# Patient Record
Sex: Male | Born: 1970 | Race: White | Hispanic: No | Marital: Single | Smoking: Former smoker
Health system: Southern US, Community
[De-identification: ages and names within clinical notes are randomized; demographics above are authoritative.]

## PROBLEM LIST (undated history)

## (undated) DIAGNOSIS — E669 Obesity, unspecified: Secondary | ICD-10-CM

## (undated) DIAGNOSIS — M1280 Other specific arthropathies, not elsewhere classified, unspecified site: Secondary | ICD-10-CM

## (undated) DIAGNOSIS — K760 Fatty (change of) liver, not elsewhere classified: Secondary | ICD-10-CM

## (undated) DIAGNOSIS — E119 Type 2 diabetes mellitus without complications: Secondary | ICD-10-CM

## (undated) HISTORY — DX: Other specific arthropathies, not elsewhere classified, unspecified site: M12.80

## (undated) HISTORY — DX: Fatty (change of) liver, not elsewhere classified: K76.0

## (undated) HISTORY — DX: Obesity, unspecified: E66.9

## (undated) HISTORY — DX: Type 2 diabetes mellitus without complications: E11.9

---

## 2005-10-29 ENCOUNTER — Ambulatory Visit: Payer: Self-pay | Admitting: Family Medicine

## 2007-12-20 ENCOUNTER — Ambulatory Visit: Payer: Self-pay | Admitting: Family Medicine

## 2009-04-19 ENCOUNTER — Ambulatory Visit: Payer: Self-pay | Admitting: Family Medicine

## 2009-04-19 DIAGNOSIS — J069 Acute upper respiratory infection, unspecified: Secondary | ICD-10-CM | POA: Insufficient documentation

## 2010-01-29 ENCOUNTER — Encounter (INDEPENDENT_AMBULATORY_CARE_PROVIDER_SITE_OTHER): Payer: Self-pay | Admitting: *Deleted

## 2010-03-11 ENCOUNTER — Ambulatory Visit: Payer: Self-pay | Admitting: Family Medicine

## 2010-03-11 DIAGNOSIS — H60339 Swimmer's ear, unspecified ear: Secondary | ICD-10-CM | POA: Insufficient documentation

## 2010-03-12 ENCOUNTER — Telehealth: Payer: Self-pay | Admitting: Family Medicine

## 2010-04-02 ENCOUNTER — Telehealth (INDEPENDENT_AMBULATORY_CARE_PROVIDER_SITE_OTHER): Payer: Self-pay | Admitting: *Deleted

## 2010-04-07 ENCOUNTER — Ambulatory Visit: Payer: Self-pay | Admitting: Family Medicine

## 2010-04-07 DIAGNOSIS — M79609 Pain in unspecified limb: Secondary | ICD-10-CM | POA: Insufficient documentation

## 2010-07-29 NOTE — Letter (Signed)
Summary: Nadara Eaton letter  [place] at [facility]  [address] [place], [place] [address]   Phone: [phone]  Fax: [phone]       01/29/2010 MRN: [id number]  [facility] [address] [place], [place]  [address]  Dear Mr. [facility] [place], and [facility] announce the retirement of Arta Silence, M.D., from full-time practice at the [facility] office effective December 26, 2009 and his plans of returning part-time.  It is important to Dr. Hetty Ely and to our practice that you understand that [facility] has seven physicians in our office for your health care needs.  We will continue to offer the same exceptional care that you have today.    Dr. Hetty Ely has spoken to many of you about his plans for retirement and returning part-time in the fall.   We will continue to work with you through the transition to schedule appointments for you in the office and meet the high standards that [facility] is committed to.   Again, it is with great pleasure that we share the news that Dr. Hetty Ely will return to [facility] at [facility] in October of 2011 with a reduced schedule.    If you have any questions, or would like to request an appointment with one of our physicians, please call us at [phone] and press the option for Scheduling an appointment.  We take pleasure in providing you with excellent patient care and look forward to seeing you at your next office visit.  Our [facility] Physicians are:  Tillman Abide, M.D. Laurita Quint, M.D. Roxy Manns, M.D. Kerby Nora, M.D. Hannah Beat, M.D. Ruthe Mannan, M.D. We proudly welcomed Raechel Ache, M.D. and Eustaquio Boyden, M.D. to the practice in July/August 2011.  Sincerely,  [facility]

## 2010-07-29 NOTE — Assessment & Plan Note (Signed)
Summary: 15 MIN INNER EAR/NEEDS TO ESTABH W/ DOC LATER/DLO   Vital Signs:  Patient profile:   40 year old male Height:      69 inches Weight:      225.75 pounds BMI:     33.46 Temp:     98.6 degrees F oral Pulse rate:   84 / minute Pulse rhythm:   regular BP sitting:   130 / 76  (left arm) Cuff size:   large  Vitals Entered By: Lewanda Rife LPN (March 11, 2010 11:20 AM) CC: ?inner ear, Rt ear hurts.   History of Present Illness: thinks he has an ear infection  started sunday and then worse monday am  swelling of outer ear and pain cannot hear out of the ear  no swimming-- ? 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 Eyes:  Denies blurring, discharge, and eye irritation. ENT:  Complains of ear discharge and earache; denies nasal congestion, postnasal drainage, sinus pressure, and sore throat. CV:  Complains of lightheadness; denies chest pain or discomfort and palpitations. Resp:  Denies cough and shortness of breath. GI:  Denies nausea and vomiting. Derm:  Denies itching and rash. Neuro:  Complains of sensation of room spinning; mild dizziness. Allergy:  Denies seasonal allergies.  Physical Exam  General:  overweight but generally well appearing  Head:  normocephalic, atraumatic, and no abnormalities observed.   Eyes:  vision grossly intact, pupils equal, pupils round, pupils reactive to light, and no injection.   Ears:  R ear-- some mild swelling of pinna with warmth but no tenderness  canal is swollen - some cloudy discharge / is hyperemic  L ear - nl with clear canal and TM Nose:  no nasal discharge.   Mouth:  pharynx pink and moist.   Neck:  No deformities,  masses, or tenderness noted. Lungs:  CTA Heart:  RRR Skin:  no rash Cervical Nodes:  No lymphadenopathy noted Psych:  normal affect, talkative and pleasant    Impression & Recommendations:  Problem # 1:  OTITIS EXTERNA, ACUTE (ICD-380.12) Assessment New  R acute otitis externa with some canal swelling and also scant cloudy drainage  will tx with cortisporin- this has worked well for him in the past  warm compress/ analgesic as needed pt advised to update me if symptoms worsen or do not improve - esp if worse ear pain or fever warned about avoiding water in ear for future off work several days  His updated medication list for this problem includes:    Cortisporin 3.5-10000-1 Soln (Neomycin-polymyxin-hc) .Marland Kitchen... 3-4 drops in affected ear 3-4 times per day (while awake)  Orders: Prescription Created Electronically [phone])  Complete Medication List: 1)  Cortisporin 3.5-10000-1 Soln (Neomycin-polymyxin-hc) .... 3-4 drops in affected ear 3-4 times per day (while awake)  Patient Instructions: 1)  use the cortisporin ear solution as directed  2)  update me if worse pain / swelling or any fever  3)  warm compress is ok over ear as needed  4)  tylenol for pain or fever if needed  5)  update me if not improving by the end of the week  Prescriptions: CORTISPORIN 3.5-10000-1 SOLN (NEOMYCIN-POLYMYXIN-HC) 3-4 drops in affected ear 3-4 times per day (while awake)  #1 bottle x 0   Entered and Authorized by:   Judith Part MD   Signed by:   Judith Part MD on 03/11/2010   Method used:   Electronically to        [id number] (retail)       [address]       [place], [place]  [address]       Ph: [phone]       Fax: [phone]   RxID:   [id number]   Prior Medications (reviewed today): None Current Allergies (reviewed today): No known allergies

## 2010-07-29 NOTE — Letter (Signed)
Summary: Out of Work  [employer] at [facility]  [address] [place], [place] [address]   Phone: [phone]  Fax: [phone]    March 11, 2010   Employee:  Tim Hawkins [employer]    To Whom It May Concern:   For Medical reasons, please excuse the above named employee from work for the following dates:  Start:   03/11/2010  End:   can return 09/15/ 2011 if he is feeling better  If you need additional information, please feel free to contact our office.         Sincerely,    Judith Part MD

## 2010-07-29 NOTE — Assessment & Plan Note (Signed)
Summary: CHECK FINGERNAIL/CLE   Vital Signs:  Patient profile:   40 year old male Height:      69 inches Weight:      225.0 pounds BMI:     33.35 Temp:     98.6 degrees F oral Pulse rate:   84 / minute Pulse rhythm:   regular BP sitting:   130 / 72  (left arm) Cuff size:   large  Vitals Entered By: Benny Lennert CMA Duncan Dull) (April 07, 2010 11:03 AM)  History of Present Illness: Chief complaint check finger nail  L middle finger not sure - does not think trauma chews nails a lot ? fungus  .REVIEW OF SYSTEMS GEN: No acute illnesses, no fever, chills, sweats. CV: No chest pain or SOB GI: No noted N or V Otherwise, pertinent positives and negatives are noted in the HPI.   GEN: Well-developed,well-nourished,in no acute distress; alert,appropriate and cooperative throughout examination HEENT: Normocephalic and atraumatic without obvious abnormalities. No apparent alopecia or balding. Ears, externally no deformities PULM: Breathing comfortably in no respiratory distress EXT: No clubbing, cyanosis, or edema, chewed fingernails, L middle finger, whitish at base PSYCH: Normally interactive. Cooperative during the interview. Pleasant. Friendly and conversant. Not anxious or depressed appearing. Normal, full affect.    Allergies (verified): No Known Drug Allergies   Impression & Recommendations:  Problem # 1:  FINGER PAIN (ICD-729.5) reassured. looks mostly like result of trauma. i would not do anything ? small degree of fungus, multiple, but will not get better with his degree of chewing  Prior Medications (reviewed today): None Current Allergies (reviewed today): No known allergies

## 2010-07-29 NOTE — Progress Notes (Signed)
Summary: Information from [employer]....  Phone Note Other Incoming   Summary of Call: [employer] called- Gave claim # [id number]- call back # [phone]. [employer] needs addtional information on a claim. Told [employer] we did not have an authorizations to release information, says she will be in contact w/ the pts to bring signed released form to the office.Daine Gip  April 02, 2010 2:24 PM  Initial call taken by: Daine Gip,  April 02, 2010 2:24 PM     Appended Document: Information from [facility].... Recd paperwork from [facility], sent to Dr. Milinda Antis...cdavis 04-08-2010  Appended Document: Information from [employer].... Dr Milinda Antis asked me to contact pt to see if he was out of work more the 03/11/10 thru 03/13/10. Pt said he went to specialist Dr Willeen Cass at [facility]. Pt was taken out of work until 03/17/10. Pt said his ear was doing fine - no problems. the form is on your shelf in the in box.  Appended Document: Information from [employer].... thanks for the update - I completed the form

## 2010-07-29 NOTE — Progress Notes (Signed)
Summary: ear is not any better  Phone Note Call from Patient Call back at Home Phone [phone]   Caller: Patient Summary of Call: Pt was seen yesterday for ear infection.  He says this is no better, still swollen and some pain. No fever.  I advised him that since he just started the antibiotic yesterday he needs to give that more time to work.  Pt agreed. Initial call taken by: Lowella Petties CMA,  March 12, 2010 2:46 PM  Follow-up for Phone Call        it is a bit early to tell  please update me tomorrow (of course earlier if fever or more pain or swelling) Follow-up by: Judith Part MD,  March 12, 2010 4:21 PM  Additional Follow-up for Phone Call Additional follow up Details #1::        Left message for patient to call back. Lewanda Rife LPN  March 12, 2010 4:40 PM   Patient notified as instructed by telephone. Lewanda Rife LPN  March 12, 2010 5:15 PM

## 2012-08-27 ENCOUNTER — Emergency Department: Payer: Self-pay | Admitting: Emergency Medicine

## 2012-08-27 DIAGNOSIS — K760 Fatty (change of) liver, not elsewhere classified: Secondary | ICD-10-CM

## 2012-08-27 HISTORY — DX: Fatty (change of) liver, not elsewhere classified: K76.0

## 2012-08-27 LAB — COMPREHENSIVE METABOLIC PANEL
Alkaline Phosphatase: 124 U/L (ref 50–136)
Anion Gap: 2 — ABNORMAL LOW (ref 7–16)
BUN: 11 mg/dL (ref 7–18)
Bilirubin,Total: 0.3 mg/dL (ref 0.2–1.0)
Calcium, Total: 9 mg/dL (ref 8.5–10.1)
EGFR (African American): >60
EGFR (Non-African Amer.): >60
Osmolality: 280 (ref 275–301)
SGOT(AST): 17 U/L (ref 15–37)
SGPT (ALT): 32 U/L (ref 12–78)
Total Protein: 7.5 g/dL (ref 6.4–8.2)

## 2012-08-27 LAB — CBC
HCT: 40.2 % (ref 40.0–52.0)
HGB: 13.3 g/dL (ref 13.0–18.0)
MCH: 28.4 pg (ref 26.0–34.0)
MCV: 86 fL (ref 80–100)
Platelet: 268 10*3/uL (ref 150–440)
RBC: 4.7 10*6/uL (ref 4.40–5.90)
WBC: 12.6 10*3/uL — ABNORMAL HIGH (ref 3.8–10.6)

## 2012-08-28 LAB — URINALYSIS, COMPLETE
Bacteria: NONE SEEN
Bilirubin,UR: NEGATIVE
Blood: NEGATIVE
Glucose,UR: NEGATIVE mg/dL (ref 0–75)
Ketone: NEGATIVE
Leukocyte Esterase: NEGATIVE
Ph: 5 (ref 4.5–8.0)
Protein: NEGATIVE
Squamous Epithelial: NONE SEEN

## 2012-08-30 ENCOUNTER — Encounter: Payer: Self-pay | Admitting: Family Medicine

## 2012-08-30 ENCOUNTER — Ambulatory Visit (INDEPENDENT_AMBULATORY_CARE_PROVIDER_SITE_OTHER): Payer: BC Managed Care – PPO | Admitting: Family Medicine

## 2012-08-30 VITALS — BP 130/78 | HR 72 | Temp 98.1°F | Ht 68.0 in | Wt 219.2 lb

## 2012-08-30 DIAGNOSIS — E669 Obesity, unspecified: Secondary | ICD-10-CM

## 2012-08-30 DIAGNOSIS — R109 Unspecified abdominal pain: Secondary | ICD-10-CM

## 2012-08-30 DIAGNOSIS — E663 Overweight: Secondary | ICD-10-CM | POA: Insufficient documentation

## 2012-08-30 HISTORY — DX: Obesity, unspecified: E66.9

## 2012-08-30 NOTE — Progress Notes (Signed)
Subjective:    Patient ID: Tim Hawkins, male    DOB: July 17, 1970, 42 y.o.   MRN: [id number]  HPI CC: transfer of care, [facility] ER f/u  Prior pt of Dr. Lorenza Chick last seen here late 2011 presents today as [facility] ER f/u - seen and treated for UTI as well as lower back pain.  Placed on cipro 500mg  10d course, ibuprofen and norco.  Went to ER Satrday night 2/2 severe pain in lower back radiating to R chest.  EKG done as well as abd [place] and abd CT negative for gallstones or kidney stones.  Records pending.  Dx with back spasms and UTI.  Has been resting since got home.  Feeling some better, continues with back pain but improved. Denies fevers/chills, abd pain, nausea/vomting, diarrhea, urinary changes (dysuria, urgency). Denies inciting trauma/injury.  ==> reviewed records from [facility] which arrived while pt in office: EKG NSR 61, UA WNL, WBC 12.6, Cr 0.73, LFTs WNL, lipase WN abd [place] - no cholelithiasis.  + HS Abd/pelvic CT w/o contrast - WNL  Body mass index is 33.34 kg/(m^2). 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 Wt Readings from Last 3 Encounters:  08/30/12 219 lb 4 oz (99.451 kg)  04/07/10 225 lb (102.059 kg)  03/11/10 225 lb 12 oz (102.4 kg)   Mother lives with him.  No pets Occupation: truck Hospital doctor Activity: tries to walk regularly Diet: good water, tries daily fruits/vegetables  Preventative: DOT physicals every 2 years. No recent CPE.  Medications and allergies reviewed and updated in chart.  Past histories reviewed and updated if relevant as below. Patient Active Problem List  Diagnosis  . OTITIS EXTERNA, ACUTE  . URI  . FINGER PAIN   History reviewed. No pertinent past medical history. Past Surgical History  Procedure Laterality Date  . None     History  Substance Use Topics  . Smoking status: Former Smoker    Types: Cigars    Quit date: 07/30/2012  . Smokeless tobacco: Never Used     Comment: 5 pack/day  . Alcohol Use: Yes     Comment: Rare    Family History  Problem Relation Age of Onset  . Diabetes Father   . CAD Father   . Cancer Neg Hx   . Stroke Neg Hx    No Known Allergies No current outpatient prescriptions on file prior to visit.   No current facility-administered medications on file prior to visit.     Review of Systems Per HPI    Objective:   Physical Exam  Nursing note and vitals reviewed. Constitutional: He appears well-developed and well-nourished. No distress.  HENT:  Head: Normocephalic and atraumatic.  Mouth/Throat: Oropharynx is clear and moist. No oropharyngeal exudate.  Eyes: Conjunctivae and EOM are normal. Pupils are equal, round, and reactive to light. No scleral icterus.  Neck: Normal range of motion. Neck supple.  Cardiovascular: Normal rate, regular rhythm, normal heart sounds and intact distal pulses.   No murmur heard. Pulmonary/Chest: Effort normal and breath sounds normal. No respiratory distress. He has no wheezes. He has no rales.  Abdominal: Soft. Normal appearance and bowel sounds are normal. He exhibits no distension and no mass. There is no hepatosplenomegaly. There is no tenderness. There is no rigidity, no rebound, no guarding, no CVA tenderness and negative Murphy's sign.  Mild pain to palpation R flank  Musculoskeletal: He exhibits no edema.  Skin: Skin is warm and dry. No rash noted.  Psychiatric: He has a normal mood  and affect.       Assessment & Plan:

## 2012-08-30 NOTE — Assessment & Plan Note (Signed)
reviewed BMI with patient Discussed healthy diet/lifestyle changes.

## 2012-08-30 NOTE — Assessment & Plan Note (Signed)
All records reviewed and asked to scan into system. With elevated WBC, treated for pyelo although never urinary sxs - rec complete 10d course cipro. Overall improving.   Discussed narcotic induced constipation as well as ibuprofen use. Letter out of work for next 2 days. rec return in 10mo to 1 yr for CPE. Red flags to seek care discussed. Pt agrees with plan.

## 2012-08-30 NOTE — Patient Instructions (Signed)
Increase water and fiber while on pain medicine.  If continued constipation, may use over the counter colace.  If not better with this, let me know. Continue ciprofloxacin twice daily for full 10 day course. Continue ibuprofen twice daily with food for next few days then try to back off. Continue hydrocodone using as needed for breakthrough pain. Let me know if pain returning, fevers, or nausea/vomiting or any trouble voiding.

## 2012-09-01 ENCOUNTER — Telehealth: Payer: Self-pay

## 2012-09-01 ENCOUNTER — Encounter: Payer: Self-pay | Admitting: *Deleted

## 2012-09-01 ENCOUNTER — Encounter: Payer: Self-pay | Admitting: Family Medicine

## 2012-09-01 ENCOUNTER — Ambulatory Visit (INDEPENDENT_AMBULATORY_CARE_PROVIDER_SITE_OTHER): Payer: BC Managed Care – PPO | Admitting: Family Medicine

## 2012-09-01 VITALS — BP 136/70 | HR 72 | Temp 98.2°F | Wt 219.0 lb

## 2012-09-01 DIAGNOSIS — R109 Unspecified abdominal pain: Secondary | ICD-10-CM

## 2012-09-01 LAB — POCT URINALYSIS DIPSTICK
Ketones, UA: NEGATIVE
Leukocytes, UA: NEGATIVE
Protein, UA: NEGATIVE
Spec Grav, UA: >=1.03
pH, UA: 6

## 2012-09-01 MED ORDER — CYCLOBENZAPRINE HCL 10 MG PO TABS: 10.0000 mg | ORAL_TABLET | Freq: Two times a day (BID) | ORAL | Status: DC | PRN

## 2012-09-01 NOTE — Progress Notes (Signed)
  Subjective:    Patient ID: Tim Hawkins, male    DOB: 06-Apr-1971, 42 y.o.   MRN: [id number]  HPI CC: R lateral side/flank  See prior note for details - seen here 08/30/2012 for [facility] f/u on 08/27/2012 records from [facility] reviewed:  EKG NSR 61, UA WNL, WBC 12.6, Cr 0.73, LFTs WNL, lipase WNL abd [place] - no cholelithiasis. + HS  Abd/pelvic CT w/o contrast - WNL Treated as pyelo given elevated WBC with 10d course cipro.  Pain started returning last 2 days.  Described as constant dull ache, certain positions cause sharp pain.  When flares - pain starts in thoracic spine and travels in bandlike distribution across right abdomen. Using ibuprofen 2-3 times daily.  Using hydrocodone for breakthrough pain. Denies rash. No fevers/chills, cough, nausea, abd pain  Denies inciting trauma/injury.  No new exercise routine.  Past Medical History  Diagnosis Date  . Obesity 08/30/2012     Review of Systems per HPI    Objective:   Physical Exam  Nursing note and vitals reviewed. Constitutional: He appears well-developed and well-nourished. No distress.  Cardiovascular: Normal rate, regular rhythm, normal heart sounds and intact distal pulses.   No murmur heard. Pulmonary/Chest: Effort normal and breath sounds normal. No respiratory distress. He has no wheezes. He has no rales.  Abdominal: Soft. Normal appearance and bowel sounds are normal. He exhibits no distension and no mass. There is no hepatosplenomegaly. There is no tenderness. There is no rigidity, no rebound, no guarding, no CVA tenderness and negative Murphy's sign. No hernia.  No skin rash mildly tender to deep palpation R lateral abdomen  Musculoskeletal: He exhibits no edema.  No midline or paraspinous mm tenderness  Skin: Skin is warm and dry. No rash noted.  Psychiatric: He has a normal mood and affect.      Assessment & Plan:

## 2012-09-01 NOTE — Patient Instructions (Addendum)
Let's check urine to ensure no blood  This could be coming from spine or from muscle spasm - treat with muscle relaxant course as well as continued ibuprofen.  Save hydrocodone for breakthrough pain. If not better, call me for xray of thoracic spine.

## 2012-09-01 NOTE — Addendum Note (Signed)
Addended by: Josph Macho A on: 09/01/2012 02:10 PM   Modules accepted: Orders

## 2012-09-01 NOTE — Telephone Encounter (Signed)
Pt wants to know what he needs to do for out of work claim for [employer]. Pt was told by [employer] he would need to sign for permission to release records to [employer]. Advised pt to come to front  Desk and sign record release. [employer] will be requesting records.

## 2012-09-01 NOTE — Assessment & Plan Note (Signed)
Persistent R flank pain that starts from spine. Normal CT without contrast in ER.  Normal abd [place] (except for hepatic steatosis).  Normal UA in ER. Recheck UA today to eval again for nephrolithiasis. Positional - ?muscle spasm. Treat with continued ibuprofen, hydrocodone for breakthrough pain, may use flexeril prn possible muscle spasm component. If persistent, obtain thoracic spine film to eval for arthritis or other spinal etiology. Pt agrees with plan. Out of work [employer] worker) 2/2 sedating meds

## 2012-09-05 ENCOUNTER — Ambulatory Visit (INDEPENDENT_AMBULATORY_CARE_PROVIDER_SITE_OTHER): Payer: BC Managed Care – PPO | Admitting: Family Medicine

## 2012-09-05 ENCOUNTER — Ambulatory Visit (INDEPENDENT_AMBULATORY_CARE_PROVIDER_SITE_OTHER)
Admission: RE | Admit: 2012-09-05 | Discharge: 2012-09-05 | Disposition: A | Discharge status: Home / Self Care | Payer: BC Managed Care – PPO | Source: Ambulatory Visit | Attending: Family Medicine | Admitting: Family Medicine

## 2012-09-05 ENCOUNTER — Telehealth: Payer: Self-pay

## 2012-09-05 ENCOUNTER — Encounter: Payer: Self-pay | Admitting: Family Medicine

## 2012-09-05 VITALS — BP 126/72 | HR 68 | Temp 98.5°F | Wt 219.0 lb

## 2012-09-05 DIAGNOSIS — R109 Unspecified abdominal pain: Secondary | ICD-10-CM

## 2012-09-05 DIAGNOSIS — K760 Fatty (change of) liver, not elsewhere classified: Secondary | ICD-10-CM

## 2012-09-05 DIAGNOSIS — E669 Obesity, unspecified: Secondary | ICD-10-CM

## 2012-09-05 DIAGNOSIS — K7689 Other specified diseases of liver: Secondary | ICD-10-CM

## 2012-09-05 NOTE — Assessment & Plan Note (Signed)
Continued R flank discomfort along with mild midline thoracic spine discomfort.  However, today more descriptive of possible gallbladder issue. Thoracic spine films today - overall normal on my read. Will order HIDA scan. Discussed return to work. Pt agrees with plan.

## 2012-09-05 NOTE — Telephone Encounter (Signed)
Pt needs doctors note for Wed and Thur for work(pt said has test scheduled Thur).Please advise.

## 2012-09-05 NOTE — Telephone Encounter (Signed)
Ok to do. Thanks.  

## 2012-09-05 NOTE — Progress Notes (Signed)
  Subjective:    Patient ID: Tim Hawkins, male    DOB: Nov 11, 1970, 42 y.o.   MRN: [id number]  HPI CC: continued R flank pain  See prior note for details.  Initial eval at ER for R flank pain - normal abd [place] (except for HS), normal noncontrast CT abd/pelvis, blood work WNL including CMP, lipase, UA (WBC 12.6).  After didn't improved, started treating presumed muscle spasm with continued ibuprofen, hydrocodone for breakthrough pain, flexeril prn possible muscle spasm component.  Prior visit thought possibly thoracic spine referred pain, discussed possible xray of thoracic spine.  Continued R flank pain and some epigastric pain.  Hurts some during day but mostly at night worse.  Last night at 3am woke him up from sleep - sharp pain in middle of back that traveled to R flank - endorses some muscle spasm.  Some epigastric discomfort associated with this along with bloating and gassiness.  Some crescendo description.  No fevers/chills, nausea.    No significant change in diet.  Last night had pepperoni pizza which is not usual for him.    Brother with gallbladder disease s/p cholecystectomy 6 mo ago (not emptying well).  Rpt UA last visit WNL.  Past Medical History  Diagnosis Date  . Obesity 08/30/2012    Review of Systems Per HPI    Objective:   Physical Exam  Nursing note and vitals reviewed. Constitutional: He appears well-developed and well-nourished. No distress.  Abdominal: Soft. Bowel sounds are normal. He exhibits no distension and no mass. There is no hepatosplenomegaly. There is tenderness (mild) in the right upper quadrant. There is no rebound, no guarding and no CVA tenderness.  Musculoskeletal:  Mild midline lower thoracic spine tenderness No paraspinous mm tenderness or tightness  Skin: Skin is warm and dry. No rash noted.  Psychiatric: He has a normal mood and affect.       Assessment & Plan:

## 2012-09-05 NOTE — Patient Instructions (Addendum)
Thoracic spine xray today. Pass by Marion's office to schedule gallbladder emptying study. For fatty liver - see below.  Work on weight loss and healthy diet. Return at your convenience fasting for cholesterol levels. Call us with an update and with any questions/concerns.  Fatty Liver Fatty liver is the accumulation of fat in liver cells. It is also called hepatosteatosis or steatohepatitis. It is normal for your liver to contain some fat. If fat is more than 5 to 10% of your liver's weight, you have fatty liver.  There are often no symptoms (problems) for years while damage is still occurring. People often learn about their fatty liver when they have medical tests for other reasons. Fat can damage your liver for years or even decades without causing problems. When it becomes severe, it can cause fatigue, weight loss, weakness, and confusion. This makes you more likely to develop more serious liver problems. The liver is the largest organ in the body. It does a lot of work and often gives no warning signs when it is sick until late in a disease. The liver has many important jobs including:  Breaking down foods.  Storing vitamins, iron, and other minerals.  Making proteins.  Making bile for food digestion.  Breaking down many products including medications, alcohol and some poisons. CAUSES  There are a number of different conditions, medications, and poisons that can cause a fatty liver. Eating too many calories causes fat to build up in the liver. Not processing and breaking fats down normally may also cause this. Certain conditions, such as obesity, diabetes, and high triglycerides also cause this. Most fatty liver patients tend to be middle-aged and over weight.  Some causes of fatty liver are:  Alcohol over consumption.  Malnutrition.  Steroid use.  Valproic acid toxicity.  Obesity.  Cushing's syndrome.  Poisons.  Tetracycline in high  dosages.  Pregnancy.  Diabetes.  Hyperlipidemia.  Rapid weight loss. Some people develop fatty liver even having none of these conditions. SYMPTOMS  Fatty liver most often causes no problems. This is called asymptomatic.  It can be diagnosed with blood tests and also by a liver biopsy.  It is one of the most common causes of minor elevations of liver enzymes on routine blood tests.  Specialized Imaging of the liver using ultrasound, CT (computed tomography) scan, or MRI (magnetic resonance imaging) can suggest a fatty liver but a biopsy is needed to confirm it.  A biopsy involves taking a small sample of liver tissue. This is done by using a needle. It is then looked at under a microscope by a specialist. TREATMENT  It is important to treat the cause. Simple fatty liver without a medical reason may not need treatment.  Weight loss, fat restriction, and exercise in overweight patients produces inconsistent results but is worth trying.  Fatty liver due to alcohol toxicity may not improve even with stopping drinking.  Good control of diabetes may reduce fatty liver.  Lower your triglycerides through diet, medication or both.  Eat a balanced, healthy diet.  Increase your physical activity.  Get regular checkups from a liver specialist.  There are no medical or surgical treatments for a fatty liver or NASH, but improving your diet and increasing your exercise may help prevent or reverse some of the damage. PROGNOSIS  Fatty liver may cause no damage or it can lead to an inflammation of the liver. This is, called steatohepatitis. When it is linked to alcohol abuse, it is called alcoholic steatohepatitis.  It often is not linked to alcohol. It is then called nonalcoholic steatohepatitis, or NASH. Over time the liver may become scarred and hardened. This condition is called cirrhosis. Cirrhosis is serious and may lead to liver failure or cancer. NASH is one of the leading causes of  cirrhosis. About 10-20% of Americans have fatty liver and a smaller 2-5% has NASH. Document Released: 07/31/2005 Document Revised: 09/07/2011 Document Reviewed: 09/23/2005 [facility] Patient Information 2013 [place], [place].

## 2012-09-05 NOTE — Assessment & Plan Note (Signed)
Discussed etiology and treatment. Body mass index is 33.31 kg/(m^2).

## 2012-09-06 ENCOUNTER — Other Ambulatory Visit: Payer: Self-pay

## 2012-09-06 ENCOUNTER — Other Ambulatory Visit (INDEPENDENT_AMBULATORY_CARE_PROVIDER_SITE_OTHER): Payer: BC Managed Care – PPO

## 2012-09-06 ENCOUNTER — Encounter: Payer: Self-pay | Admitting: *Deleted

## 2012-09-06 DIAGNOSIS — E669 Obesity, unspecified: Secondary | ICD-10-CM

## 2012-09-06 LAB — LIPID PANEL
Total CHOL/HDL Ratio: 7
VLDL: 55.6 mg/dL — ABNORMAL HIGH (ref 0.0–40.0)

## 2012-09-06 MED ORDER — HYDROCODONE-ACETAMINOPHEN 5-325 MG PO TABS: 1.0000 | ORAL_TABLET | Freq: Four times a day (QID) | ORAL | Status: DC | PRN

## 2012-09-06 MED ORDER — IBUPROFEN 800 MG PO TABS: 800.0000 mg | ORAL_TABLET | Freq: Three times a day (TID) | ORAL | Status: DC | PRN

## 2012-09-06 NOTE — Telephone Encounter (Signed)
Pt still having back and side pain; pain level 5; pt is scheduled for gall bladder test tomorrow and request refill on hydrocodone-apap and ibuprofen; pt also wants to know if needs to continue cipro; pt has 2 pills left. [facility].Please advise.

## 2012-09-06 NOTE — Telephone Encounter (Signed)
Letter written and patient notified. Placed up front for pick up.

## 2012-09-06 NOTE — Telephone Encounter (Signed)
Recommend finish cipro then may stop. plz phone in hydrocodone. Sent in ibuprofen refill.

## 2012-09-07 NOTE — Telephone Encounter (Signed)
Patient notified

## 2012-09-07 NOTE — Telephone Encounter (Signed)
Rx called in as directed.   

## 2012-09-07 NOTE — Addendum Note (Signed)
Addended by: Eustaquio Boyden on: 09/07/2012 06:20 PM   Modules accepted: Orders

## 2012-09-08 ENCOUNTER — Other Ambulatory Visit: Payer: Self-pay | Admitting: Family Medicine

## 2012-09-08 ENCOUNTER — Encounter: Payer: Self-pay | Admitting: *Deleted

## 2012-09-08 ENCOUNTER — Ambulatory Visit: Payer: Self-pay | Admitting: Family Medicine

## 2012-09-08 DIAGNOSIS — K828 Other specified diseases of gallbladder: Secondary | ICD-10-CM

## 2012-09-08 NOTE — Addendum Note (Signed)
Addended by: Eustaquio Boyden on: 09/08/2012 08:17 AM   Modules accepted: Orders

## 2012-09-13 ENCOUNTER — Encounter: Payer: Self-pay | Admitting: Family Medicine

## 2012-09-14 ENCOUNTER — Ambulatory Visit: Payer: Self-pay | Admitting: General Surgery

## 2012-09-22 ENCOUNTER — Encounter: Payer: Self-pay | Admitting: Family Medicine

## 2012-10-04 ENCOUNTER — Encounter: Payer: Self-pay | Admitting: General Surgery

## 2012-10-04 ENCOUNTER — Ambulatory Visit (INDEPENDENT_AMBULATORY_CARE_PROVIDER_SITE_OTHER): Payer: BC Managed Care – PPO | Admitting: General Surgery

## 2012-10-04 VITALS — BP 120/74 | HR 74 | Resp 14 | Ht 68.0 in | Wt 215.0 lb

## 2012-10-04 DIAGNOSIS — R1011 Right upper quadrant pain: Secondary | ICD-10-CM

## 2012-10-04 NOTE — Patient Instructions (Addendum)
Patient to callus if he is having any recurrent   abdominal pain or symptoms n/v, bowel changes , fever or chills.

## 2012-10-04 NOTE — Progress Notes (Signed)
Patient ID: Tim Hawkins, male   DOB: 1971-03-09, 42 y.o.   MRN: [id number]  Chief Complaint  Patient presents with  . new patient    epigastric pain    HPI [name] is a 42 y.o. male here today for epigastric pain about three or four  weeks ago. Had CT and US abdomen and then  had a hida scan  09/08/12. The pain subsided after a week and a half. Patient states now his right side hurts only occasionally-mostly when his eats beef or greasey  Foods. Overall he is doing much better. HPI  Past Medical History  Diagnosis Date  . Obesity 08/30/2012  . Fatty liver 08/2012    hepatic steatosis per abd [place] at [facility]    Past Surgical History  Procedure Laterality Date  . None      Family History  Problem Relation Age of Onset  . Diabetes Father   . CAD Father   . Cancer Neg Hx   . Stroke Neg Hx     Social History History  Substance Use Topics  . Smoking status: Current Every Day Smoker -- 1.00 packs/day for 5 years    Types: Cigars    Last Attempt to Quit: 07/30/2012  . Smokeless tobacco: Never Used     Comment: 5 pack/day  . Alcohol Use: No     Comment: Rare    No Known Allergies  Current Outpatient Prescriptions  Medication Sig Dispense Refill  . ciprofloxacin (CIPRO) 500 MG tablet Take 500 mg by mouth 2 (two) times daily.      . cyclobenzaprine (FLEXERIL) 10 MG tablet Take 1 tablet (10 mg total) by mouth 2 (two) times daily as needed for muscle spasms (sedation).  30 tablet  0  . HYDROcodone-acetaminophen (NORCO/VICODIN) 5-325 MG per tablet Take 1 tablet by mouth every 6 (six) hours as needed for pain.  30 tablet  0  . ibuprofen (ADVIL,MOTRIN) 800 MG tablet Take 1 tablet (800 mg total) by mouth every 8 (eight) hours as needed for pain.  60 tablet  0   No current facility-administered medications for this visit.    Review of Systems Review of Systems  Constitutional: Negative.   Respiratory: Negative.   Cardiovascular: Negative.   Gastrointestinal:  Negative.     Blood pressure 120/74, pulse 74, resp. rate 14, height 5\' 8"  (1.727 m), weight 215 lb (97.523 kg).  Physical Exam Physical Exam  Constitutional: He appears well-developed and well-nourished.  Neck: Normal range of motion. Neck supple.  Cardiovascular: Normal rate, regular rhythm and normal heart sounds.   Pulmonary/Chest: Effort normal and breath sounds normal.  Abdominal: Soft. Bowel sounds are normal. He exhibits no distension and no mass. There is no hepatosplenomegaly. There is no tenderness. No hernia. Hernia confirmed negative in the ventral area.    Data Reviewed CT abdomen, abd [place]  Both normal. HIDA scan showed delay in transit from liver to the intestine but gb EF was normal. No pain with Sianclide injection.  Assessment    Appears pt's pain has resolved. HIDA findings are of uncertain explanation.      Plan    Do  not feel his pain was biliary in nature. Will reassess if his pain recurs        Saige Busby G 10/04/2012, 7:36 PM

## 2013-04-06 ENCOUNTER — Encounter: Payer: Self-pay | Admitting: Family Medicine

## 2013-04-06 ENCOUNTER — Ambulatory Visit (INDEPENDENT_AMBULATORY_CARE_PROVIDER_SITE_OTHER): Payer: BC Managed Care – PPO | Admitting: Family Medicine

## 2013-04-06 VITALS — BP 116/78 | HR 80 | Temp 98.0°F | Wt 213.2 lb

## 2013-04-06 DIAGNOSIS — J019 Acute sinusitis, unspecified: Secondary | ICD-10-CM

## 2013-04-06 NOTE — Assessment & Plan Note (Signed)
Anticipate viral given short duration. Supportive care as per instructions. Letter for work provided today. Red flags to return discussed.

## 2013-04-06 NOTE — Progress Notes (Signed)
  Subjective:    Patient ID: Tim Hawkins, male    DOB: [birth year], 42 y.o.   MRN: [id number]  HPI CC: sinusitis?  4d h/o sinus pressure and facial pain. + PNDrainage. Getting worse.  Head pressure, facial pressure.  + tooth pain.  Blowing nose with productive sputum, but now more dry.  So far has tried nothing for this yet other than ibuprofen. No fevers/chills, abd pain, nausea, ear pain.  No significant cough.  No body aches or new rashes.  No sick contacts at home. No h/o asthma, COPD. Smoking - trying to quit.  Down to a few cig a day.  Past Medical History  Diagnosis Date  . Obesity 08/30/2012  . Fatty liver 08/2012    hepatic steatosis per abd [place] at [facility]     Review of Systems Per HPI    Objective:   Physical Exam  Nursing note and vitals reviewed. Constitutional: He appears well-developed and well-nourished. No distress.  Tired appearing  HENT:  Head: Normocephalic and atraumatic.  Right Ear: Hearing, tympanic membrane, external ear and ear canal normal.  Left Ear: Hearing, tympanic membrane, external ear and ear canal normal.  Nose: Nose normal. No mucosal edema or rhinorrhea. Right sinus exhibits no maxillary sinus tenderness and no frontal sinus tenderness. Left sinus exhibits no maxillary sinus tenderness and no frontal sinus tenderness.  Mouth/Throat: Uvula is midline and mucous membranes are normal. Posterior oropharyngeal edema and posterior oropharyngeal erythema present. No oropharyngeal exudate or tonsillar abscesses.  congested  Eyes: Conjunctivae and EOM are normal. Pupils are equal, round, and reactive to light. No scleral icterus.  Neck: Normal range of motion. Neck supple. No thyromegaly present.  Cardiovascular: Normal rate, regular rhythm, normal heart sounds and intact distal pulses.   No murmur heard. Pulmonary/Chest: Effort normal and breath sounds normal. No respiratory distress. He has no wheezes. He has no rales.  Lymphadenopathy:    He  has no cervical adenopathy.  Skin: Skin is warm and dry. No rash noted.       Assessment & Plan:

## 2013-04-06 NOTE — Patient Instructions (Addendum)
You have a sinus infection, likely viral. Take ibuprofen 600mg  three times a day with food. Push fluids and plenty of rest. Nasal saline irrigation or neti pot to help drain sinuses. Use claritin D or pseudophed over the counter to help with congestion. If worsening fever >101, worsening productive cough, or not improving as expected, let me know.

## 2013-04-13 ENCOUNTER — Telehealth: Payer: Self-pay

## 2013-04-13 MED ORDER — AMOXICILLIN-POT CLAVULANATE 875-125 MG PO TABS: 1.0000 | ORAL_TABLET | Freq: Two times a day (BID) | ORAL | Status: AC

## 2013-04-13 NOTE — Telephone Encounter (Signed)
plz notify I've sent in antibiotic course for 10 days to [facility] to treat likely bacterial sinus infection given prolonged duration. If not improved with this to notify us.

## 2013-04-13 NOTE — Telephone Encounter (Signed)
Pt was seen 04/06/13; pt using nasal saline, ibuprofen and claritin D with no improvement; pt still has non productive cough with facial pain and teeth pain on rt side of face. No fever, wheezing,h/a or SOB. [facility].Please advise.

## 2013-04-14 NOTE — Telephone Encounter (Signed)
Patient notified

## 2013-06-20 ENCOUNTER — Ambulatory Visit (INDEPENDENT_AMBULATORY_CARE_PROVIDER_SITE_OTHER): Payer: BC Managed Care – PPO | Admitting: Family Medicine

## 2013-06-20 ENCOUNTER — Encounter: Payer: Self-pay | Admitting: Family Medicine

## 2013-06-20 VITALS — BP 140/88 | HR 74 | Temp 98.3°F | Wt 213.0 lb

## 2013-06-20 DIAGNOSIS — E119 Type 2 diabetes mellitus without complications: Secondary | ICD-10-CM

## 2013-06-20 DIAGNOSIS — Z23 Encounter for immunization: Secondary | ICD-10-CM

## 2013-06-20 DIAGNOSIS — R7309 Other abnormal glucose: Secondary | ICD-10-CM

## 2013-06-20 DIAGNOSIS — E118 Type 2 diabetes mellitus with unspecified complications: Secondary | ICD-10-CM | POA: Insufficient documentation

## 2013-06-20 DIAGNOSIS — R739 Hyperglycemia, unspecified: Secondary | ICD-10-CM

## 2013-06-20 DIAGNOSIS — E1165 Type 2 diabetes mellitus with hyperglycemia: Secondary | ICD-10-CM | POA: Insufficient documentation

## 2013-06-20 HISTORY — DX: Type 2 diabetes mellitus without complications: E11.9

## 2013-06-20 LAB — BASIC METABOLIC PANEL
CO2: 29 mEq/L (ref 19–32)
Calcium: 9.2 mg/dL (ref 8.4–10.5)
GFR: 211.05 mL/min (ref >60.00)
Sodium: 137 mEq/L (ref 135–145)

## 2013-06-20 NOTE — Progress Notes (Signed)
   Subjective:    Patient ID: Tim Hawkins, male    DOB: 24-Dec-1970, 42 y.o.   MRN: [id number]  HPI CC: elevated sugar at DOT  Presents with wife today.  At recent DOT physical this month, told had random sugar value in the 200s, advised see PCP.  This was after a regular soda drink. Denies polyuria, polyphagia, polydyspia.  No weight changes.  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 But otherwise wife endorses healthy diet - prepared lunches from home. Wt Readings from Last 3 Encounters:  06/20/13 213 lb (96.616 kg)  04/06/13 213 lb 4 oz (96.73 kg)  10/04/12 215 lb (97.523 kg)  Body mass index is 32.39 kg/(m^2).   Smoking - on e cig.  Prior was smoking cigars.  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  Past Medical History  Diagnosis Date  . Obesity 08/30/2012  . Fatty liver 08/2012    hepatic steatosis per abd [place] at [facility]   Past Surgical History  Procedure Laterality Date  . None      Review of Systems Per HPI    Objective:   Physical Exam  Nursing note and vitals reviewed. Constitutional: He appears well-developed and well-nourished. No distress.  HENT:  Mouth/Throat: Oropharynx is clear and moist. No oropharyngeal exudate.  Cardiovascular: Normal rate, regular rhythm, normal heart sounds and intact distal pulses.   No murmur heard. Pulmonary/Chest: Effort normal and breath sounds normal. No respiratory distress. He has no wheezes. He has no rales.  Musculoskeletal: He exhibits no edema.       Assessment & Plan:

## 2013-06-20 NOTE — Addendum Note (Signed)
Addended by: Roena Malady on: 06/20/2013 02:37 PM   Modules accepted: Orders

## 2013-06-20 NOTE — Patient Instructions (Signed)
Flu shot today Blood work today - we will call you with results. In meantime- watch added sugars and sweetened beverages.  Work on weight loss. Return in 3 months for follow up

## 2013-06-20 NOTE — Progress Notes (Signed)
Pre-visit discussion using our clinic review tool. No additional management support is needed unless otherwise documented below in the visit note.  

## 2013-06-20 NOTE — Assessment & Plan Note (Signed)
Reviewed concerns with random sugar >200. Check A1c today. Discussed pathophysiology of type 2 diabetes and common medical treatment of metfcormin including common side effects. Pt would be interested in DSME if A1c in diabetes range. If A1c elevated, return in 32m o for f/u and start metformin. Discussed importance of diet modification and regular exercise for weight loss to help control sugars.

## 2013-06-23 LAB — HEMOGLOBIN A1C: Hgb A1c MFr Bld: 9.8 % — ABNORMAL HIGH (ref 4.6–6.5)

## 2013-06-25 ENCOUNTER — Encounter: Payer: Self-pay | Admitting: Family Medicine

## 2013-06-25 ENCOUNTER — Other Ambulatory Visit: Payer: Self-pay | Admitting: Family Medicine

## 2013-06-25 MED ORDER — METFORMIN HCL 500 MG PO TABS: 500.0000 mg | ORAL_TABLET | Freq: Two times a day (BID) | ORAL | Status: DC

## 2013-11-01 ENCOUNTER — Ambulatory Visit: Payer: BC Managed Care – PPO | Admitting: Family Medicine

## 2013-11-07 ENCOUNTER — Encounter: Payer: Self-pay | Admitting: Family Medicine

## 2013-11-07 ENCOUNTER — Other Ambulatory Visit: Payer: Self-pay | Admitting: *Deleted

## 2013-11-07 ENCOUNTER — Ambulatory Visit (INDEPENDENT_AMBULATORY_CARE_PROVIDER_SITE_OTHER): Payer: BC Managed Care – PPO | Admitting: Family Medicine

## 2013-11-07 VITALS — BP 126/74 | HR 68 | Temp 98.1°F | Wt 204.8 lb

## 2013-11-07 DIAGNOSIS — IMO0002 Reserved for concepts with insufficient information to code with codable children: Secondary | ICD-10-CM

## 2013-11-07 DIAGNOSIS — E1169 Type 2 diabetes mellitus with other specified complication: Secondary | ICD-10-CM | POA: Insufficient documentation

## 2013-11-07 DIAGNOSIS — E785 Hyperlipidemia, unspecified: Secondary | ICD-10-CM

## 2013-11-07 DIAGNOSIS — IMO0001 Reserved for inherently not codable concepts without codable children: Secondary | ICD-10-CM

## 2013-11-07 DIAGNOSIS — E669 Obesity, unspecified: Secondary | ICD-10-CM

## 2013-11-07 DIAGNOSIS — E1165 Type 2 diabetes mellitus with hyperglycemia: Secondary | ICD-10-CM

## 2013-11-07 LAB — BASIC METABOLIC PANEL
BUN: 14 mg/dL (ref 6–23)
CHLORIDE: 105 meq/L (ref 96–112)
CO2: 28 meq/L (ref 19–32)
Calcium: 9.1 mg/dL (ref 8.4–10.5)
Creatinine, Ser: 0.6 mg/dL (ref 0.4–1.5)
GFR: 161.23 mL/min (ref >60.00)
Glucose, Bld: 121 mg/dL — ABNORMAL HIGH (ref 70–99)
Potassium: 4.4 mEq/L (ref 3.5–5.1)
Sodium: 138 mEq/L (ref 135–145)

## 2013-11-07 LAB — LIPID PANEL
CHOL/HDL RATIO: 5
CHOLESTEROL: 152 mg/dL (ref 0–200)
HDL: 28.8 mg/dL — ABNORMAL LOW (ref >39.00)
LDL CALC: 91 mg/dL (ref 0–99)
Triglycerides: 162 mg/dL — ABNORMAL HIGH (ref 0.0–149.0)
VLDL: 32.4 mg/dL (ref 0.0–40.0)

## 2013-11-07 LAB — HEMOGLOBIN A1C: Hgb A1c MFr Bld: 6.9 % — ABNORMAL HIGH (ref 4.6–6.5)

## 2013-11-07 MED ORDER — METFORMIN HCL 500 MG PO TABS: 500.0000 mg | ORAL_TABLET | Freq: Two times a day (BID) | ORAL | Status: DC

## 2013-11-07 NOTE — Assessment & Plan Note (Signed)
Check FLP today - discussed possible addition of statin based on results.

## 2013-11-07 NOTE — Assessment & Plan Note (Signed)
9 lb weight loss noted today - congratulated

## 2013-11-07 NOTE — Progress Notes (Signed)
Pre visit review using our clinic review tool, if applicable. No additional management support is needed unless otherwise documented below in the visit note. 

## 2013-11-07 NOTE — Assessment & Plan Note (Signed)
Significant improvement in sugars today - continue metformin. Check A1c and titrate accordingly. Will need to discuss DSME next visit.

## 2013-11-07 NOTE — Progress Notes (Signed)
BP 126/74  Pulse 68  Temp(Src) 98.1 F (36.7 C) (Oral)  Wt 204 lb 12 oz (92.874 kg)   CC: f/u DM  Subjective:    Patient ID: Tim Hawkins, male    DOB: 01-28-1971, 43 y.o.   MRN: [id number]  HPI: Tim Hawkins is a 43 y.o. male presenting on 11/07/2013 for Follow-up   DM - regularly does check sugars daily fasting - 130.  Compliant with antihyperglycemic regimen which includes: metformin 500mg  bid.  Denies low sugars or hypoglycemic symptoms.  Denies paresthesias. Last diabetic eye exam DUE.  Pneumovax: today.  Prevnar: not done yet.  Stopped sodas as well. Lab Results  Component Value Date   HGBA1C 9.8* 06/20/2013    Smoking - 3 wks since quit!  Did e cig for 1 month then stopped that too.  Wt Readings from Last 3 Encounters:  11/07/13 204 lb 12 oz (92.874 kg)  06/20/13 213 lb (96.616 kg)  04/06/13 213 lb 4 oz (96.73 kg)   Body mass index is 31.14 kg/(m^2).  Relevant past medical, surgical, family and social history reviewed and updated as indicated.  Allergies and medications reviewed and updated. No current outpatient prescriptions on file prior to visit.   No current facility-administered medications on file prior to visit.    Review of Systems Per HPI unless specifically indicated above    Objective:    BP 126/74  Pulse 68  Temp(Src) 98.1 F (36.7 C) (Oral)  Wt 204 lb 12 oz (92.874 kg)  Physical Exam  Nursing note and vitals reviewed. Constitutional: He appears well-developed and well-nourished. No distress.  HENT:  Head: Normocephalic and atraumatic.  Right Ear: External ear normal.  Left Ear: External ear normal.  Nose: Nose normal.  Mouth/Throat: Oropharynx is clear and moist. No oropharyngeal exudate.  Eyes: Conjunctivae and EOM are normal. Pupils are equal, round, and reactive to light. No scleral icterus.  Neck: Normal range of motion. Neck supple.  Cardiovascular: Normal rate, regular rhythm, normal heart sounds and intact distal  pulses.   No murmur heard. Pulmonary/Chest: Effort normal and breath sounds normal. No respiratory distress. He has no wheezes. He has no rales.  Musculoskeletal: He exhibits no edema.  Diabetic foot exam: Normal inspection No skin breakdown No calluses  Normal DP/PT pulses Normal sensation to light tough and monofilament Nails normal  Lymphadenopathy:    He has no cervical adenopathy.  Skin: Skin is warm and dry. No rash noted.  Psychiatric: He has a normal mood and affect.   Results for orders placed in visit on 02/54/27  BASIC METABOLIC PANEL      Result Value Ref Range   Sodium 137  135 - 145 mEq/L   Potassium 4.6  3.5 - 5.1 mEq/L   Chloride 102  96 - 112 mEq/L   CO2 29  19 - 32 mEq/L   Glucose, Bld 229 (*) 70 - 99 mg/dL   BUN 10  6 - 23 mg/dL   Creatinine, Ser 0.5  0.4 - 1.5 mg/dL   Calcium 9.2  8.4 - 10.5 mg/dL   GFR 211.05  >60.00 mL/min  HEMOGLOBIN A1C      Result Value Ref Range   Hemoglobin A1C 9.8 (*) 4.6 - 6.5 %      Assessment & Plan:   Problem List Items Addressed This Visit   Obesity     9 lb weight loss noted today - congratulated    Relevant Medications  metFORMIN (GLUCOPHAGE) tablet   Diabetes type 2, uncontrolled - Primary     Significant improvement in sugars today - continue metformin. Check A1c and titrate accordingly. Will need to discuss DSME next visit.    Relevant Medications      metFORMIN (GLUCOPHAGE) tablet   Other Relevant Orders      HM DIABETES FOOT EXAM (Completed)      Basic metabolic panel      Hemoglobin A1c      Lipid panel   Dyslipidemia     Check FLP today - discussed possible addition of statin based on results.        Follow up plan: Return in about 6 months (around 05/10/2014), or as needed, for follow up visit.

## 2013-11-07 NOTE — Patient Instructions (Signed)
Great job with better sugar control, weight loss, and quitting smoking! Blood work today - we will call you with results.

## 2013-11-08 ENCOUNTER — Encounter: Payer: Self-pay | Admitting: *Deleted

## 2013-12-12 ENCOUNTER — Encounter: Payer: Self-pay | Admitting: Family Medicine

## 2013-12-12 ENCOUNTER — Encounter: Payer: Self-pay | Admitting: *Deleted

## 2013-12-12 ENCOUNTER — Ambulatory Visit (INDEPENDENT_AMBULATORY_CARE_PROVIDER_SITE_OTHER): Payer: BC Managed Care – PPO | Admitting: Family Medicine

## 2013-12-12 VITALS — BP 138/78 | HR 92 | Temp 98.2°F | Wt 210.5 lb

## 2013-12-12 DIAGNOSIS — R21 Rash and other nonspecific skin eruption: Secondary | ICD-10-CM | POA: Insufficient documentation

## 2013-12-12 MED ORDER — CLOTRIMAZOLE 1 % EX CREA: 1.0000 "application " | TOPICAL_CREAM | Freq: Two times a day (BID) | CUTANEOUS | Status: DC

## 2013-12-12 NOTE — Progress Notes (Signed)
   BP 138/78  Pulse 92  Temp(Src) 98.2 F (36.8 C) (Oral)  Wt 210 lb 8 oz (95.482 kg)   CC: L hand rash  Subjective:    Patient ID: Tim Hawkins, male    DOB: 02-Jun-1971, 43 y.o.   MRN: [id number]  HPI: Tim Hawkins is a 43 y.o. male presenting on 12/12/2013 for Rash   Noticed rash on L hand that started 1.5 wks ago. intially was clearing up now has returned. Not pruritic, not significantly pruritic. Did initially drain small amt clear fluid.   No new lotions, detergents, soaps, shampoos. No new medicines. No new foods. No other skin rash or oral lesions. Does not work with chemicals.  H/o L middle fingernail fungus present for >1 year. Does bite nails.  Wt Readings from Last 3 Encounters:  12/12/13 210 lb 8 oz (95.482 kg)  11/07/13 204 lb 12 oz (92.874 kg)  06/20/13 213 lb (96.616 kg)    Relevant past medical, surgical, family and social history reviewed and updated as indicated.  Allergies and medications reviewed and updated. Current Outpatient Prescriptions on File Prior to Visit  Medication Sig  . metFORMIN (GLUCOPHAGE) 500 MG tablet Take 1 tablet (500 mg total) by mouth 2 (two) times daily with a meal.   No current facility-administered medications on file prior to visit.    Review of Systems Per HPI unless specifically indicated above    Objective:    BP 138/78  Pulse 92  Temp(Src) 98.2 F (36.8 C) (Oral)  Wt 210 lb 8 oz (95.482 kg)  Physical Exam  Nursing note and vitals reviewed. Constitutional: He appears well-developed and well-nourished. No distress.  Skin: Skin is warm and dry. Rash noted.  R palm slightly dry L dorsal lateral hand with annular rash with raised borders and central clearing L palm with mild scaling of skin throughout palm. Nonpruritic.       Assessment & Plan:   Problem List Items Addressed This Visit   Rash of hands - Primary     Anticipate tinea manum of L hand. Discussed this as well as recommended treatment,  and handout provided. Given localized to L hand, will treat with topical antifungal for next 3-4 wks. As not significantly pruritic or inflamed, did not add steroid to topical regimen. If not better consider oral antifungal. Update if not improving as expected or any worsening. Pt agrees with plan. Does have evidence of some onychomycosis of L middle fingernail.        Follow up plan: Return if symptoms worsen or fail to improve.

## 2013-12-12 NOTE — Progress Notes (Signed)
Pre visit review using our clinic review tool, if applicable. No additional management support is needed unless otherwise documented below in the visit note. 

## 2013-12-12 NOTE — Assessment & Plan Note (Signed)
Anticipate tinea manum of L hand. Discussed this as well as recommended treatment, and handout provided. Given localized to L hand, will treat with topical antifungal for next 3-4 wks. As not significantly pruritic or inflamed, did not add steroid to topical regimen. If not better consider oral antifungal. Update if not improving as expected or any worsening. Pt agrees with plan. Does have evidence of some onychomycosis of L middle fingernail.

## 2013-12-12 NOTE — Patient Instructions (Signed)
I think you have fungal infection of hand or ringworm of the hand. Treat with topical antifungal (lotrimin or clotrimazole) twice daily for 4 weeks. Let me know if not improving with this or sooner if any worsening.  Body Ringworm Ringworm (tinea corporis) is a fungal infection of the skin on the body. This infection is not caused by worms, but is actually caused by a fungus. Fungus normally lives on the top of your skin and can be useful. However, in the case of ringworms, the fungus grows out of control and causes a skin infection. It can involve any area of skin on the body and can spread easily from one person to another (contagious). Ringworm is a common problem for children, but it can affect adults as well. Ringworm is also often found in athletes, especially wrestlers who share equipment and mats.  CAUSES  Ringworm of the body is caused by a fungus called dermatophyte. It can spread by:  Touchingother people who are infected.  Touchinginfected pets.  Touching or sharingobjects that have been in contact with the infected person or pet (hats, combs, towels, clothing, sports equipment). SYMPTOMS   Itchy, raised red spots and bumps on the skin.  Ring-shaped rash.  Redness near the border of the rash with a clear center.  Dry and scaly skin on or around the rash. Not every person develops a ring-shaped rash. Some develop only the red, scaly patches. DIAGNOSIS  Most often, ringworm can be diagnosed by performing a skin exam. Your caregiver may choose to take a skin scraping from the affected area. The sample will be examined under the microscope to see if the fungus is present.  TREATMENT  Body ringworm may be treated with a topical antifungal cream or ointment. Sometimes, an antifungal shampoo that can be used on your body is prescribed. You may be prescribed antifungal medicines to take by mouth if your ringworm is severe, keeps coming back, or lasts a long time.  HOME CARE  INSTRUCTIONS   Only take over-the-counter or prescription medicines as directed by your caregiver.  Wash the infected area and dry it completely before applying yourcream or ointment.  When using antifungal shampoo to treat the ringworm, leave the shampoo on the body for 3 5 minutes before rinsing.   Wear loose clothing to stop clothes from rubbing and irritating the rash.  Wash or change your bed sheets every night while you have the rash.  Have your pet treated by your veterinarian if it has the same infection. To prevent ringworm:   Practice good hygiene.  Wear sandals or shoes in public places and showers.  Do not share personal items with others.  Avoid touching red patches of skin on other people.  Avoid touching pets that have bald spots or wash your hands after doing so. SEEK MEDICAL CARE IF:   Your rash continues to spread after 7 days of treatment.  Your rash is not gone in 4 weeks.  The area around your rash becomes red, warm, tender, and swollen. Document Released: 06/12/2000 Document Revised: 03/09/2012 Document Reviewed: 12/28/2011 [facility] Patient Information 2014 [place].

## 2014-04-27 ENCOUNTER — Encounter: Payer: Self-pay | Admitting: Family Medicine

## 2014-04-27 ENCOUNTER — Ambulatory Visit (INDEPENDENT_AMBULATORY_CARE_PROVIDER_SITE_OTHER): Payer: BC Managed Care – PPO | Admitting: Family Medicine

## 2014-04-27 VITALS — BP 134/78 | HR 84 | Temp 98.3°F | Wt 210.8 lb

## 2014-04-27 DIAGNOSIS — IMO0002 Reserved for concepts with insufficient information to code with codable children: Secondary | ICD-10-CM

## 2014-04-27 DIAGNOSIS — E1165 Type 2 diabetes mellitus with hyperglycemia: Secondary | ICD-10-CM

## 2014-04-27 LAB — MICROALBUMIN / CREATININE URINE RATIO
CREATININE, U: 20.6 mg/dL
MICROALB UR: 0.8 mg/dL (ref 0.0–1.9)
MICROALB/CREAT RATIO: 3.9 mg/g (ref 0.0–30.0)

## 2014-04-27 LAB — HEMOGLOBIN A1C: Hgb A1c MFr Bld: 6.8 % — ABNORMAL HIGH (ref 4.6–6.5)

## 2014-04-27 NOTE — Progress Notes (Signed)
Pre visit review using our clinic review tool, if applicable. No additional management support is needed unless otherwise documented below in the visit note. 

## 2014-04-27 NOTE — Patient Instructions (Addendum)
Schedule eye appointment. Flu shot at [facility] physical. Blood work today to check A1c. Great job with sugar control to date. Good to see you today, call us with questions.

## 2014-04-27 NOTE — Assessment & Plan Note (Signed)
Chronic, stable. Continue metformin 500mg bid. Good control based on recall cbgs. Check A1c today. Reviewed dietary choices to keep good control of blood sugars.

## 2014-04-27 NOTE — Progress Notes (Signed)
   BP 134/78  Pulse 84  Temp(Src) 98.3 F (36.8 C) (Oral)  Wt 210 lb 12 oz (95.596 kg)   CC: 6 month follow up  Subjective:    Patient ID: Tim Hawkins, male    DOB: 04-Jun-1971, 43 y.o.   MRN: [id number]  HPI: Tim Hawkins is a 43 y.o. male presenting on 04/27/2014 for Follow-up   Flu shot - at [employer] physical.  DM - regularly does check sugars daily fasting - 100-130. Compliant with antihyperglycemic regimen which includes: metformin 500mg  bid. Denies low sugars or hypoglycemic symptoms. Denies paresthesias. Last diabetic eye exam DUE. Pneumovax: 2015. Prevnar: not due yet. Stopped regular sodas as well. Lab Results  Component Value Date   HGBA1C 6.9* 11/07/2013   Wt Readings from Last 3 Encounters:  04/27/14 210 lb 12 oz (95.596 kg)  12/12/13 210 lb 8 oz (95.482 kg)  11/07/13 204 lb 12 oz (92.874 kg)  Body mass index is 32.05 kg/(m^2).  Hasn't been walking as much as previously  Relevant past medical, surgical, family and social history reviewed and updated as indicated.  Allergies and medications reviewed and updated. Current Outpatient Prescriptions on File Prior to Visit  Medication Sig  . metFORMIN (GLUCOPHAGE) 500 MG tablet Take 1 tablet (500 mg total) by mouth 2 (two) times daily with a meal.   No current facility-administered medications on file prior to visit.    Review of Systems Per HPI unless specifically indicated above    Objective:    BP 134/78  Pulse 84  Temp(Src) 98.3 F (36.8 C) (Oral)  Wt 210 lb 12 oz (95.596 kg)  Physical Exam  Nursing note and vitals reviewed. Constitutional: He appears well-developed and well-nourished. No distress.  HENT:  Head: Normocephalic and atraumatic.  Right Ear: External ear normal.  Left Ear: External ear normal.  Nose: Nose normal.  Mouth/Throat: Oropharynx is clear and moist. No oropharyngeal exudate.  Eyes: Conjunctivae and EOM are normal. Pupils are equal, round, and reactive to light. No scleral  icterus.  Neck: Normal range of motion. Neck supple.  Cardiovascular: Normal rate, regular rhythm, normal heart sounds and intact distal pulses.   No murmur heard. Pulmonary/Chest: Effort normal and breath sounds normal. No respiratory distress. He has no wheezes. He has no rales.  Musculoskeletal: He exhibits no edema.  Diabetic foot exam: Normal inspection No skin breakdown No calluses  Normal DP/PT pulses Normal sensation to light tough and monofilament Nails normal  Lymphadenopathy:    He has no cervical adenopathy.  Skin: Skin is warm and dry. No rash noted.  Psychiatric: He has a normal mood and affect.       Assessment & Plan:   Problem List Items Addressed This Visit   Diabetes type 2, controlled - Primary     Chronic, stable. Continue metformin 500mg bid. Good control based on recall cbgs. Check A1c today. Reviewed dietary choices to keep good control of blood sugars.        Follow up plan: Return in about 6 months (around 10/27/2014), or as needed, for annual exam, prior fasting for blood work.

## 2014-04-30 ENCOUNTER — Encounter: Payer: Self-pay | Admitting: *Deleted

## 2014-05-10 ENCOUNTER — Ambulatory Visit: Payer: BC Managed Care – PPO | Admitting: Family Medicine

## 2014-06-29 LAB — HM DIABETES EYE EXAM

## 2014-10-20 ENCOUNTER — Other Ambulatory Visit: Payer: Self-pay | Admitting: Family Medicine

## 2014-10-20 DIAGNOSIS — E785 Hyperlipidemia, unspecified: Secondary | ICD-10-CM

## 2014-10-20 DIAGNOSIS — E119 Type 2 diabetes mellitus without complications: Secondary | ICD-10-CM

## 2014-10-20 DIAGNOSIS — K760 Fatty (change of) liver, not elsewhere classified: Secondary | ICD-10-CM

## 2014-10-22 ENCOUNTER — Other Ambulatory Visit: Payer: BC Managed Care – PPO

## 2014-10-24 ENCOUNTER — Other Ambulatory Visit (INDEPENDENT_AMBULATORY_CARE_PROVIDER_SITE_OTHER): Payer: BLUE CROSS/BLUE SHIELD

## 2014-10-24 DIAGNOSIS — K760 Fatty (change of) liver, not elsewhere classified: Secondary | ICD-10-CM | POA: Diagnosis not present

## 2014-10-24 DIAGNOSIS — E785 Hyperlipidemia, unspecified: Secondary | ICD-10-CM

## 2014-10-24 DIAGNOSIS — E119 Type 2 diabetes mellitus without complications: Secondary | ICD-10-CM | POA: Diagnosis not present

## 2014-10-24 LAB — COMPREHENSIVE METABOLIC PANEL
ALBUMIN: 4.3 g/dL (ref 3.5–5.2)
ALT: 31 U/L (ref 0–53)
AST: 21 U/L (ref 0–37)
Alkaline Phosphatase: 92 U/L (ref 39–117)
BUN: 13 mg/dL (ref 6–23)
CALCIUM: 9.5 mg/dL (ref 8.4–10.5)
CHLORIDE: 102 meq/L (ref 96–112)
CO2: 30 meq/L (ref 19–32)
Creatinine, Ser: 0.48 mg/dL (ref 0.40–1.50)
GFR: 199.72 mL/min (ref >60.00)
Glucose, Bld: 118 mg/dL — ABNORMAL HIGH (ref 70–99)
Potassium: 4.3 mEq/L (ref 3.5–5.1)
Sodium: 136 mEq/L (ref 135–145)
TOTAL PROTEIN: 7.1 g/dL (ref 6.0–8.3)
Total Bilirubin: 0.6 mg/dL (ref 0.2–1.2)

## 2014-10-24 LAB — LIPID PANEL
CHOLESTEROL: 146 mg/dL (ref 0–200)
HDL: 30.6 mg/dL — ABNORMAL LOW (ref >39.00)
LDL Cholesterol: 85 mg/dL (ref 0–99)
NonHDL: 115.4
Total CHOL/HDL Ratio: 5
Triglycerides: 153 mg/dL — ABNORMAL HIGH (ref 0.0–149.0)
VLDL: 30.6 mg/dL (ref 0.0–40.0)

## 2014-10-24 LAB — TSH: TSH: 0.69 u[IU]/mL (ref 0.35–4.50)

## 2014-10-24 LAB — HEMOGLOBIN A1C: Hgb A1c MFr Bld: 6.7 % — ABNORMAL HIGH (ref 4.6–6.5)

## 2014-10-29 ENCOUNTER — Encounter: Payer: BC Managed Care – PPO | Admitting: Family Medicine

## 2014-10-31 ENCOUNTER — Encounter: Payer: Self-pay | Admitting: Family Medicine

## 2014-10-31 ENCOUNTER — Ambulatory Visit (INDEPENDENT_AMBULATORY_CARE_PROVIDER_SITE_OTHER): Payer: BLUE CROSS/BLUE SHIELD | Admitting: Family Medicine

## 2014-10-31 VITALS — BP 136/82 | HR 92 | Temp 98.2°F | Ht 68.0 in | Wt 210.0 lb

## 2014-10-31 DIAGNOSIS — Z23 Encounter for immunization: Secondary | ICD-10-CM

## 2014-10-31 DIAGNOSIS — Z0000 Encounter for general adult medical examination without abnormal findings: Secondary | ICD-10-CM | POA: Diagnosis not present

## 2014-10-31 DIAGNOSIS — E669 Obesity, unspecified: Secondary | ICD-10-CM

## 2014-10-31 DIAGNOSIS — E119 Type 2 diabetes mellitus without complications: Secondary | ICD-10-CM

## 2014-10-31 DIAGNOSIS — K760 Fatty (change of) liver, not elsewhere classified: Secondary | ICD-10-CM

## 2014-10-31 DIAGNOSIS — E785 Hyperlipidemia, unspecified: Secondary | ICD-10-CM

## 2014-10-31 NOTE — Progress Notes (Addendum)
BP 136/82 mmHg  Pulse 92  Temp(Src) 98.2 F (36.8 C) (Oral)  Ht 5\' 8"  (1.727 m)  Wt 210 lb (95.255 kg)  BMI 31.94 kg/m2   CC: CPE  Subjective:    Patient ID: Tim Hawkins, male    DOB: 13-Oct-1970, 44 y.o.   MRN: [id number]  HPI: Tim Hawkins is a 44 y.o. 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 checks fasting a few times a week. Not as compliant with diet as he should be. Eye exam: 06/2014. Foot exam last visit. No paresthesias.  Preventative: Influenza shot not received Td 2004. Tdap today Pneumovax today Discussed seat belt use  Discussed sunscreen use. No changing moles  Mother lives with him. No pets Occupation: truck Geophysicist/field seismologist Activity: tries to walk regularly Diet: good water, tries daily fruits/vegetables  Relevant past medical, surgical, family and social history reviewed and updated as indicated. Interim medical history since our last visit reviewed. Allergies and medications reviewed and updated. Current Outpatient Prescriptions on File Prior to Visit  Medication Sig  . metFORMIN (GLUCOPHAGE) 500 MG tablet Take 1 tablet (500 mg total) by mouth 2 (two) times daily with a meal.   No current facility-administered medications on file prior to visit.    Review of Systems  Constitutional: Negative for fever, chills, activity change, appetite change, fatigue and unexpected weight change.  HENT: Negative for hearing loss.   Eyes: Negative for visual disturbance.  Respiratory: Negative for cough, chest tightness, shortness of breath and wheezing.   Cardiovascular: Negative for chest pain, palpitations and leg swelling.  Gastrointestinal: Negative for nausea, vomiting, abdominal pain, diarrhea, constipation, blood in stool and abdominal distention.  Genitourinary: Negative for hematuria and difficulty urinating.  Musculoskeletal:  Negative for myalgias, arthralgias and neck pain.  Skin: Negative for rash.  Neurological: Negative for dizziness, seizures, syncope and headaches.  Hematological: Negative for adenopathy. Does not bruise/bleed easily.  Psychiatric/Behavioral: Negative for dysphoric mood. The patient is not nervous/anxious.    Per HPI unless specifically indicated above     Objective:    BP 136/82 mmHg  Pulse 92  Temp(Src) 98.2 F (36.8 C) (Oral)  Ht 5\' 8"  (1.727 m)  Wt 210 lb (95.255 kg)  BMI 31.94 kg/m2  Wt Readings from Last 3 Encounters:  10/31/14 210 lb (95.255 kg)  04/27/14 210 lb 12 oz (95.596 kg)  12/12/13 210 lb 8 oz (95.482 kg)    Physical Exam  Constitutional: He is oriented to person, place, and time. He appears well-developed and well-nourished. No distress.  HENT:  Head: Normocephalic and atraumatic.  Right Ear: Hearing, tympanic membrane, external ear and ear canal normal.  Left Ear: Hearing, tympanic membrane, external ear and ear canal normal.  Nose: Nose normal.  Mouth/Throat: Uvula is midline, oropharynx is clear and moist and mucous membranes are normal. No oropharyngeal exudate, posterior oropharyngeal edema or posterior oropharyngeal erythema.  Eyes: Conjunctivae and EOM are normal. Pupils are equal, round, and reactive to light. No scleral icterus.  Neck: Normal range of motion. Neck supple.  Cardiovascular: Normal rate, regular rhythm, normal heart sounds and intact distal pulses.   No murmur heard. Pulses:      Radial pulses are 2+ on the right side, and 2+ on the left side.  Pulmonary/Chest: Effort normal and breath sounds normal. No respiratory distress. He has no wheezes. He has no  rales.  Abdominal: Soft. Bowel sounds are normal. He exhibits no distension and no mass. There is no tenderness. There is no rebound and no guarding.  Musculoskeletal: Normal range of motion. He exhibits no edema.  Lymphadenopathy:    He has no cervical adenopathy.  Neurological: He is  alert and oriented to person, place, and time.  CN grossly intact, station and gait intact  Skin: Skin is warm and dry. No rash noted.  Psychiatric: He has a normal mood and affect. His behavior is normal. Judgment and thought content normal.  Nursing note and vitals reviewed.  Results for orders placed or performed in visit on 10/31/14  HM DIABETES EYE EXAM  Result Value Ref Range   HM Diabetic Eye Exam No Retinopathy No Retinopathy      Assessment & Plan:   Problem List Items Addressed This Visit    Obesity    Body mass index is 31.94 kg/(m^2).  Discussed healthy lifestyle changes to affect sustainable weight loss.      Hepatic steatosis    Normal LFTs today.      Health maintenance examination - Primary    Preventative protocols reviewed and updated unless pt declined. Discussed healthy diet and lifestyle.       Dyslipidemia    Marked improvement with better glycemic control. Off meds. Discussed increased aerobic exercise to raise HDL.      Diabetes type 2, controlled    Chronic, stable. Continue metformin 500mg  bid. RTC 6 mo DM f/u          Follow up plan: Return in about 6 months (around 05/03/2015), or as needed, for follow up visit.

## 2014-10-31 NOTE — Addendum Note (Signed)
Addended by: Royann Shivers A on: 10/31/2014 12:13 PM   Modules accepted: Orders

## 2014-10-31 NOTE — Assessment & Plan Note (Signed)
Body mass index is 31.94 kg/(m^2).  Discussed healthy lifestyle changes to affect sustainable weight loss.

## 2014-10-31 NOTE — Assessment & Plan Note (Signed)
Chronic, stable. Continue metformin 500mg  bid. RTC 6 mo DM f/u

## 2014-10-31 NOTE — Addendum Note (Signed)
Addended by: Ria Bush on: 10/31/2014 12:10 PM   Modules accepted: Miquel Dunn

## 2014-10-31 NOTE — Patient Instructions (Addendum)
Tdap today. Pneumovax today. Return as needed or in 6 months for diabetes follow up. Return at your convenience for skin tag removal

## 2014-10-31 NOTE — Assessment & Plan Note (Signed)
Marked improvement with better glycemic control. Off meds. Discussed increased aerobic exercise to raise HDL.

## 2014-10-31 NOTE — Progress Notes (Signed)
Pre visit review using our clinic review tool, if applicable. No additional management support is needed unless otherwise documented below in the visit note. 

## 2014-10-31 NOTE — Assessment & Plan Note (Signed)
Normal LFTs today.

## 2014-10-31 NOTE — Assessment & Plan Note (Signed)
Preventative protocols reviewed and updated unless pt declined. Discussed healthy diet and lifestyle.  

## 2014-12-12 ENCOUNTER — Other Ambulatory Visit: Payer: Self-pay | Admitting: Family Medicine

## 2014-12-14 ENCOUNTER — Ambulatory Visit: Payer: BLUE CROSS/BLUE SHIELD | Admitting: Family Medicine

## 2014-12-20 ENCOUNTER — Ambulatory Visit (INDEPENDENT_AMBULATORY_CARE_PROVIDER_SITE_OTHER): Payer: BLUE CROSS/BLUE SHIELD | Admitting: Family Medicine

## 2014-12-20 ENCOUNTER — Encounter: Payer: Self-pay | Admitting: Family Medicine

## 2014-12-20 VITALS — BP 120/60 | HR 79 | Wt 209.0 lb

## 2014-12-20 DIAGNOSIS — L918 Other hypertrophic disorders of the skin: Secondary | ICD-10-CM | POA: Diagnosis not present

## 2014-12-20 DIAGNOSIS — L989 Disorder of the skin and subcutaneous tissue, unspecified: Secondary | ICD-10-CM

## 2014-12-20 NOTE — Progress Notes (Signed)
S: The patient complains of symptomatic skin growths on the R axillary region, L axillary region, R neck, and L upper buttock. These are irritated by clothing, jewelry and rubbing. Present for years, R axillary lesion may be growing.  O: Patient appears well. Several benign skin growths, some exophytic, are noted bilateral axilla, right neck, left upper buttocks.  A: Skin growths  P: Skin growths x4 are snipped off using alcohol pads for cleansing and sterile iris scissors. Local anesthesia was used (~ 2 cc's of 2% lidocaine with epi). Hemostasis achieved if needed with silver nitrate. These pathognomonic lesions are not sent for pathology.  Larger growth from R axillary region sent to pathology.

## 2014-12-20 NOTE — Patient Instructions (Signed)
Keep spots clean and dry. May change daily for a few days then should heal well. Let us know if spreading redness, or new drainage developing. Good to see you today, call us with questions.

## 2014-12-20 NOTE — Addendum Note (Signed)
Addended by: Despina Hidden on: 12/20/2014 10:55 AM   Modules accepted: Orders

## 2014-12-20 NOTE — Progress Notes (Signed)
Pre visit review using our clinic review tool, if applicable. No additional management support is needed unless otherwise documented below in the visit note. 

## 2014-12-20 NOTE — Assessment & Plan Note (Signed)
See note. Pt tolerated well. Larger growth sent to pathology. Anticipate neurofibroma

## 2014-12-27 ENCOUNTER — Encounter: Payer: Self-pay | Admitting: *Deleted

## 2015-01-03 IMAGING — CT CT STONE STUDY
1 of 2 series · 15 of 32 positions shown, 19 images · non-contrast
Comparison: None

REASON FOR EXAM: right flank pain
COMMENTS:

PROCEDURE:     CT  - CT ABDOMEN /PELVIS WO (STONE)  - August 28, 2012  [DATE]
RESULT:     Indication: Flank Pain
TECHNIQUE: Multiple axial images from the lung bases to the symphysis pubis
were obtained without oral and without intravenous contrast.

[Series 2: 3mm soft tissue · axial · 0.77mm/px · z∈[-502,-38]mm · 15 of 171 slices shown, 19 images]
[im 8/171  soft-tissue]
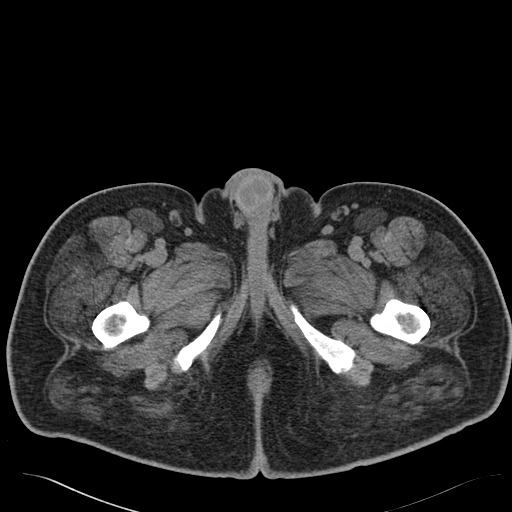
[im 8/171  bone]
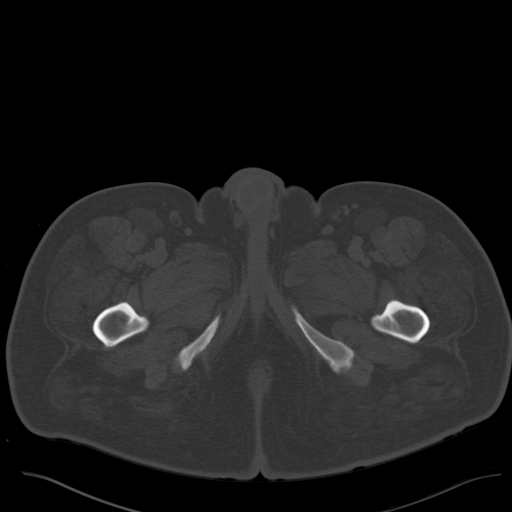
[im 23/171  soft-tissue]
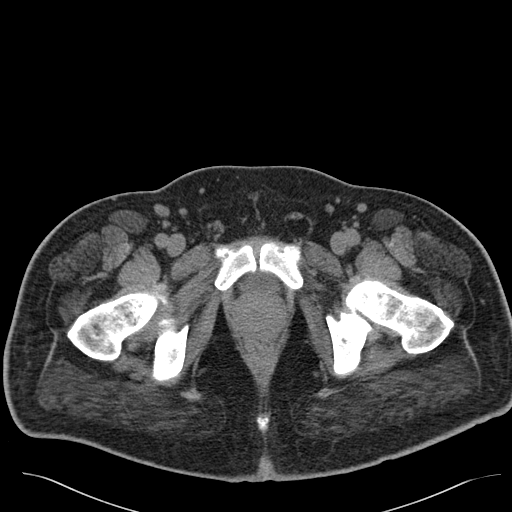
[im 37/171  soft-tissue]
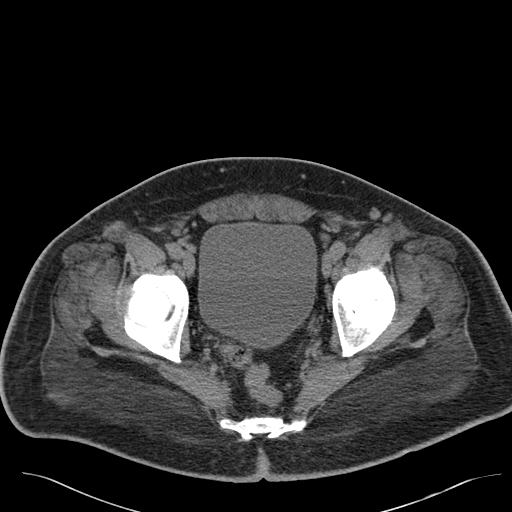
[im 45/171  soft-tissue]
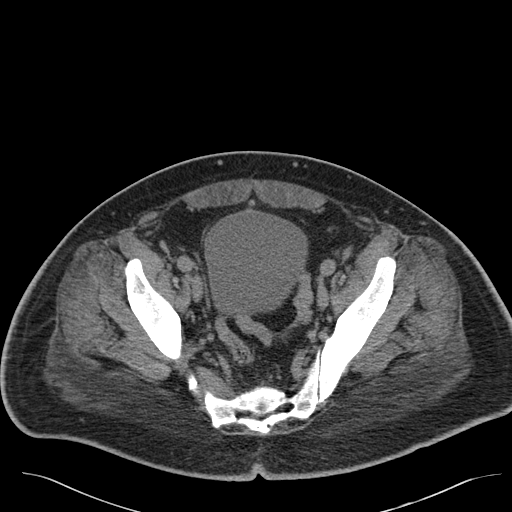
[im 60/171  soft-tissue]
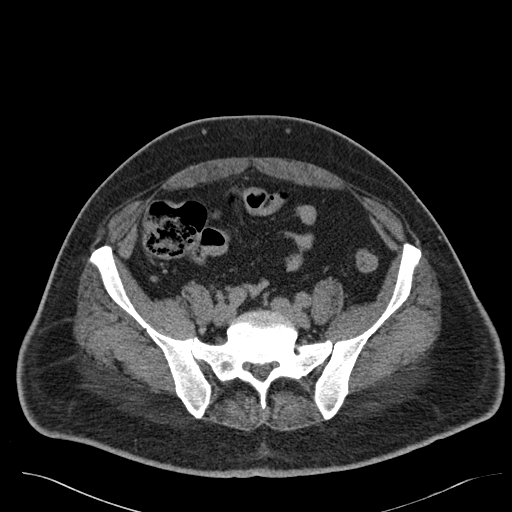
[im 74/171  soft-tissue]
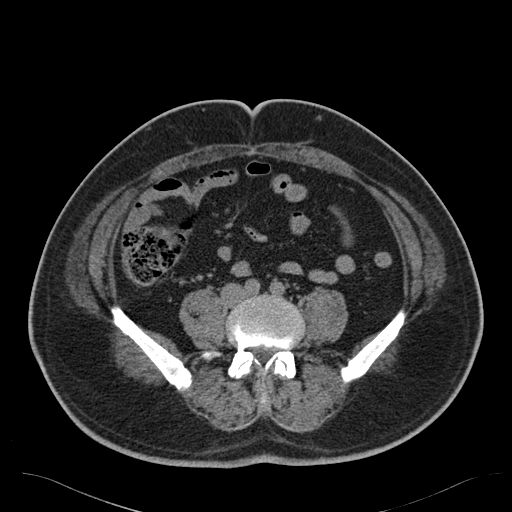
[im 89/171  soft-tissue]
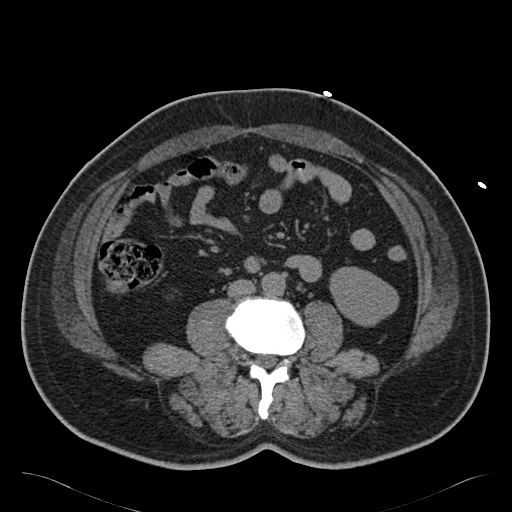
[im 97/171  soft-tissue]
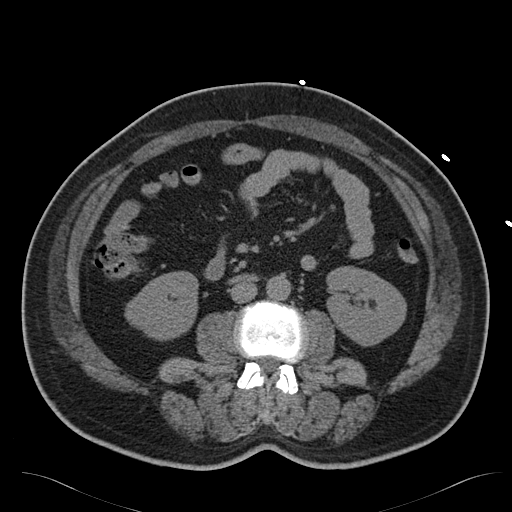
[im 111/171  soft-tissue]
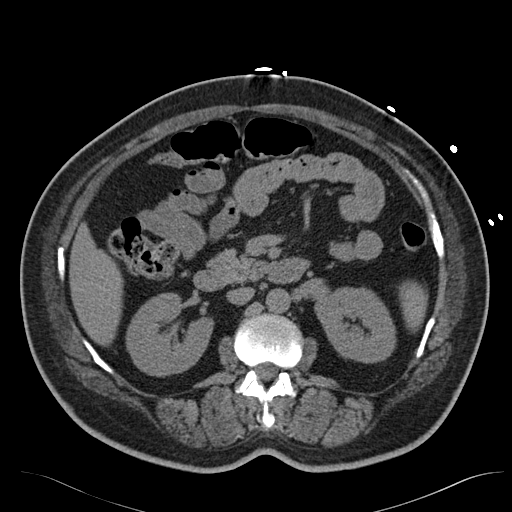
[im 111/171  bone]
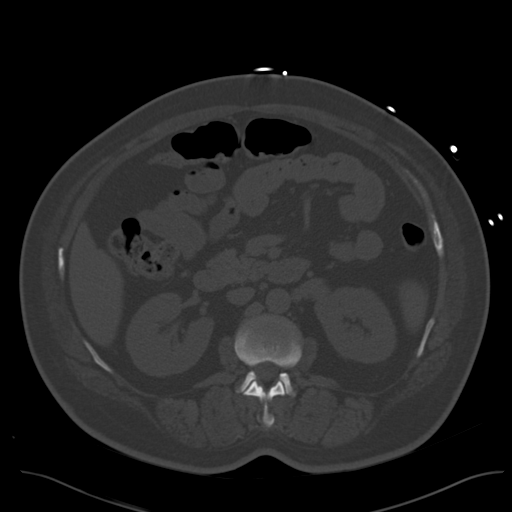
[im 126/171  soft-tissue]
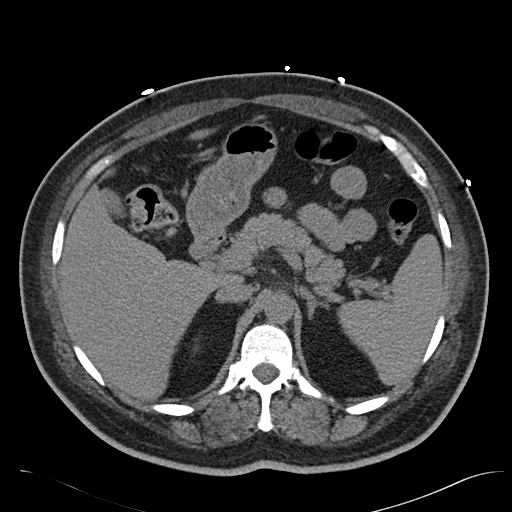
[im 134/171  soft-tissue]
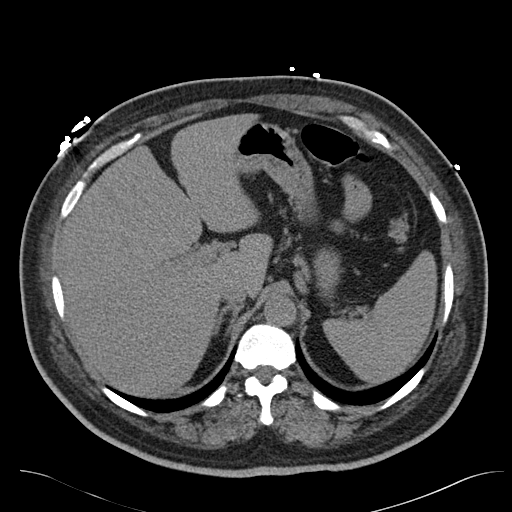
[im 141/171  lung]
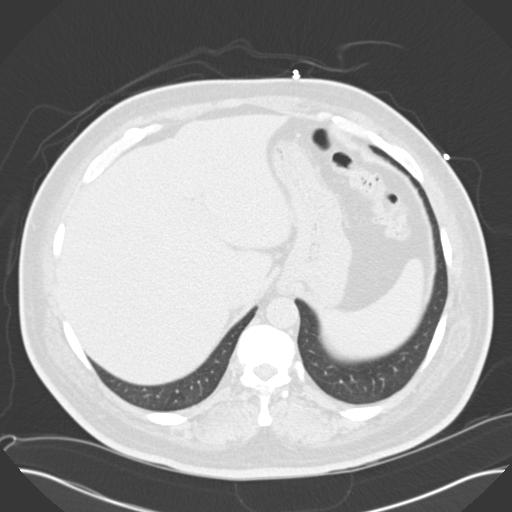
[im 148/171  soft-tissue]
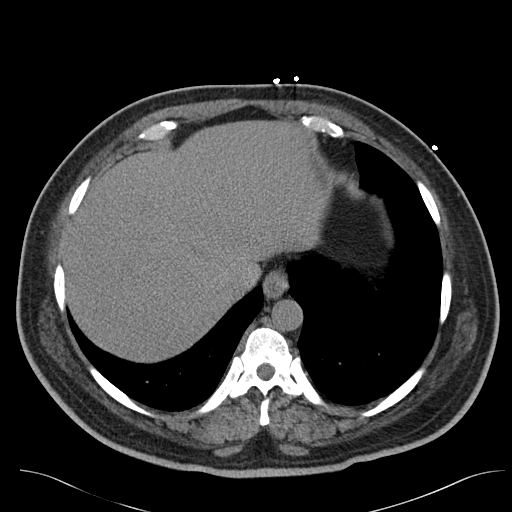
[im 148/171  lung]
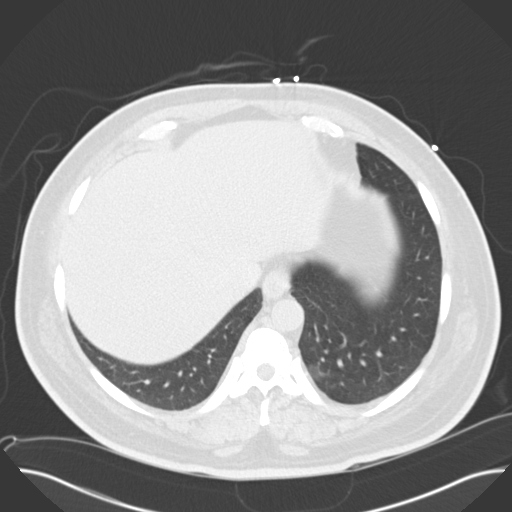
[im 156/171  lung]
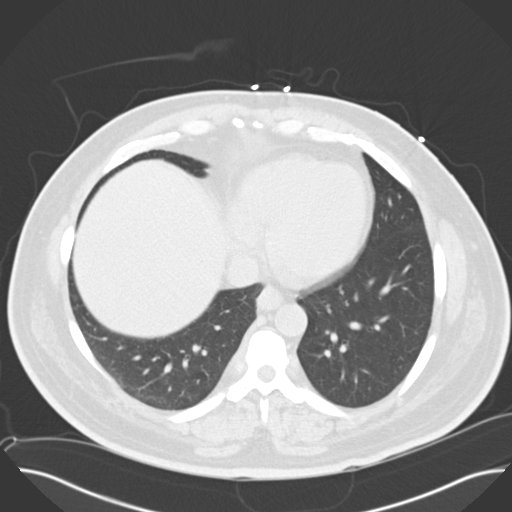
[im 163/171  soft-tissue]
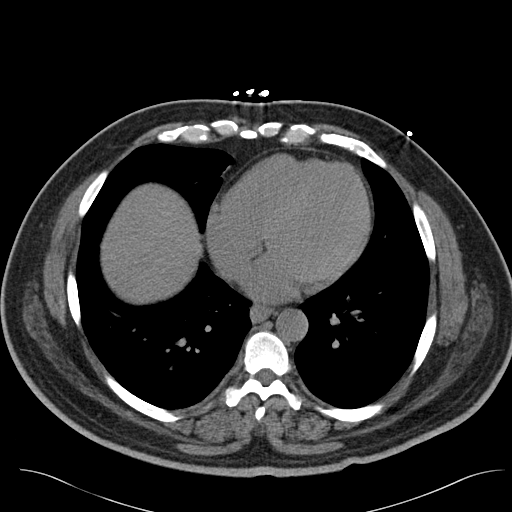
[im 163/171  lung]
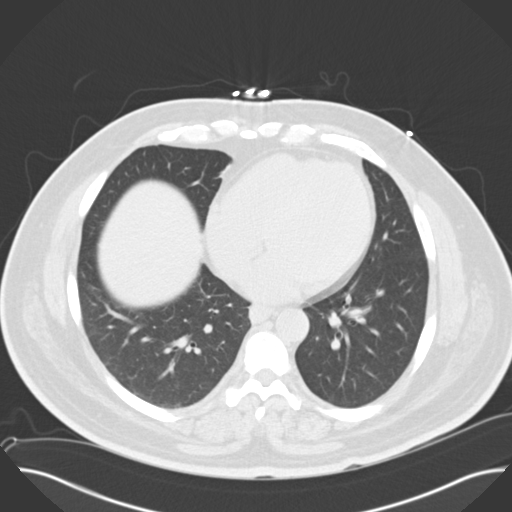

[15 of 32 positions shown; findings below may reference images not displayed]

FINDINGS: The lung bases are clear. There is no pleural or pericardial effusions.

No renal, ureteral, or bladder calculi. No obstructive uropathy. No
perinephric stranding is seen. The kidneys are symmetric in size without
evidence for exophytic mass. The bladder is unremarkable.

The liver is unremarkable. The gallbladder is unremarkable. The spleen
demonstrates no focal abnormality. The adrenal glands and pancreas are
normal.

The unopacified stomach, duodenum, small intestine, and large intestine are
unremarkable, but evaluation is limited by lack of oral contrast. There is a
normal caliber appendix in the right lower quadrant without periappendiceal
inflammatory changes. There is no pneumoperitoneum, pneumatosis, or portal
venous gas. There is no abdominal or pelvic free fluid. There is no
lymphadenopathy.

The abdominal aorta is normal in caliber .

The osseous structures are unremarkable.
IMPRESSION: 1. No urolithiasis or obstructive uropathy.

[REDACTED]

## 2015-01-03 IMAGING — US ABDOMEN ULTRASOUND LIMITED
1 series · 14 of 25 positions shown · non-contrast
Comparison: none

REASON FOR EXAM: RUQ pain
COMMENTS:   Body Site: GB and Fossa, CBD, Head of Pancreas

PROCEDURE:     US  - US ABDOMEN LIMITED SURVEY  - August 28, 2012  [DATE]
RESULT:     Comparison: None
TECHNIQUE: Multiple gray-scale and color-flow Doppler images of the right
upper quadrant are presented for review.

[Series 1: abdomen ultrasound limited · 0.38mm/px · 14 of 31 slices shown]
[im 1/31]
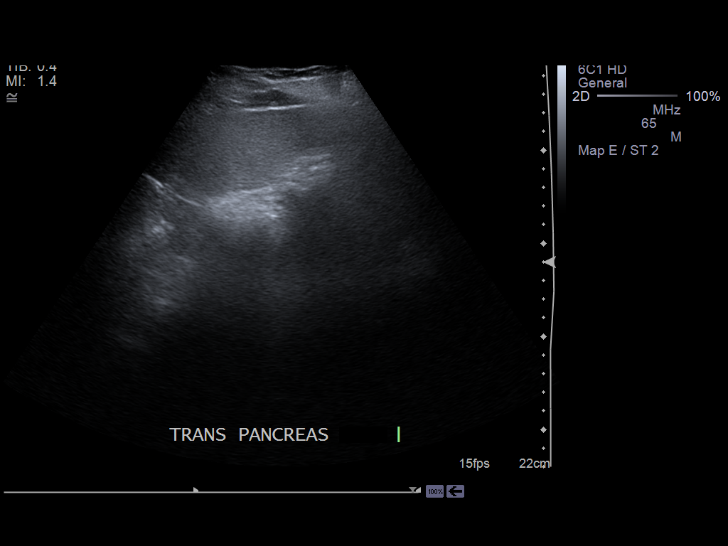
[im 3/31]
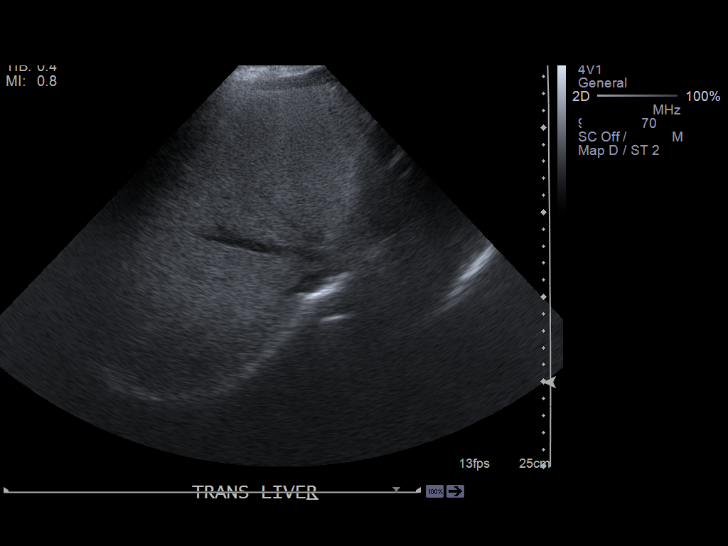
[im 6/31]
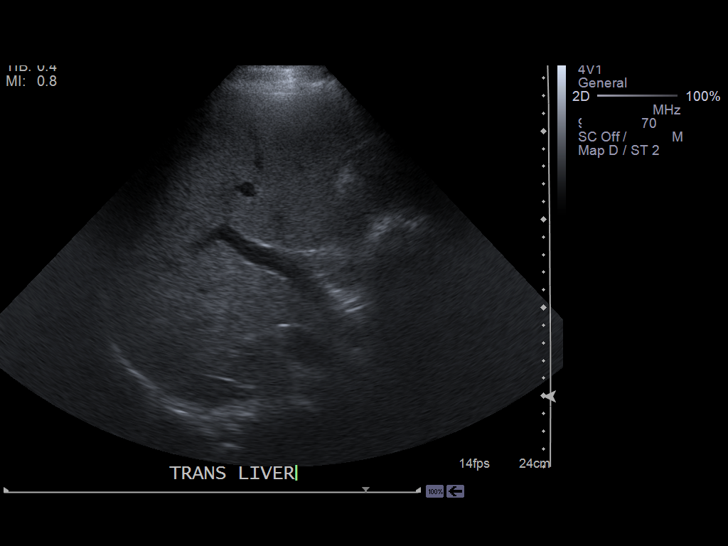
[im 8/31]
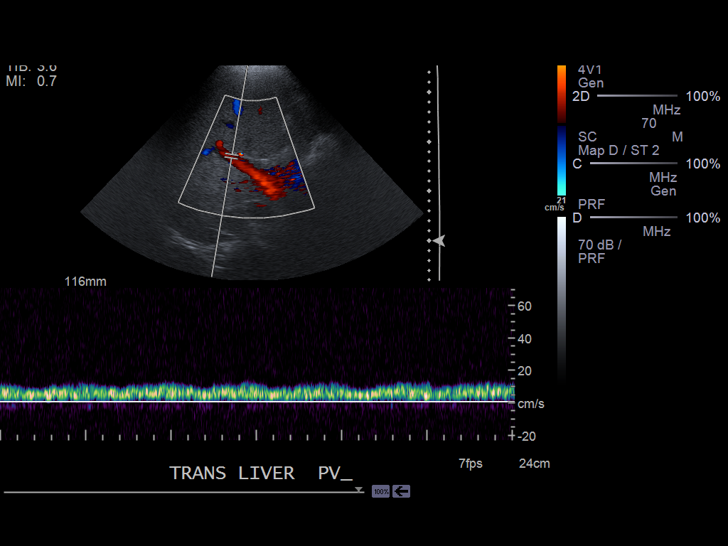
[im 11/31]
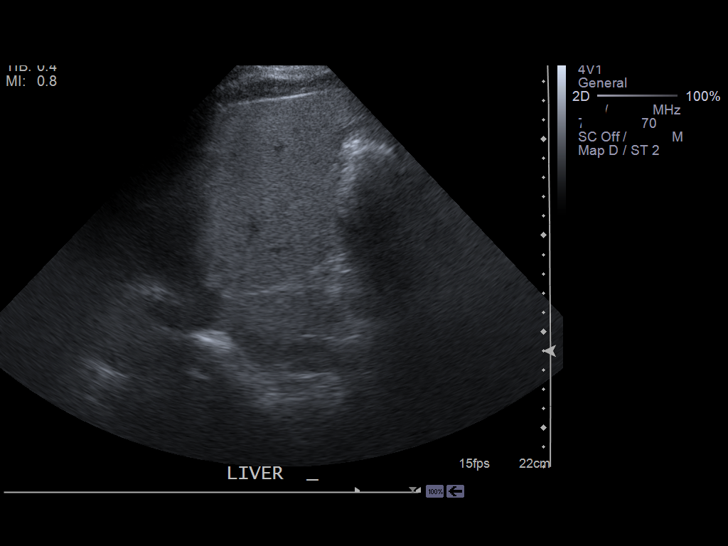
[im 12/31]
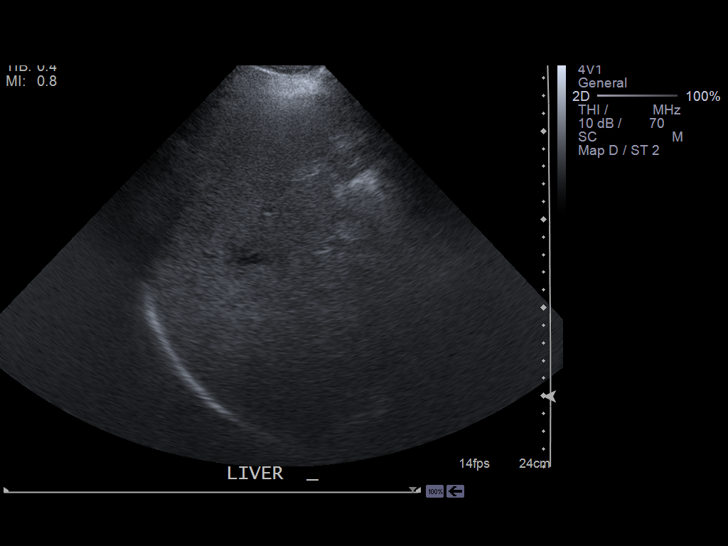
[im 14/31]
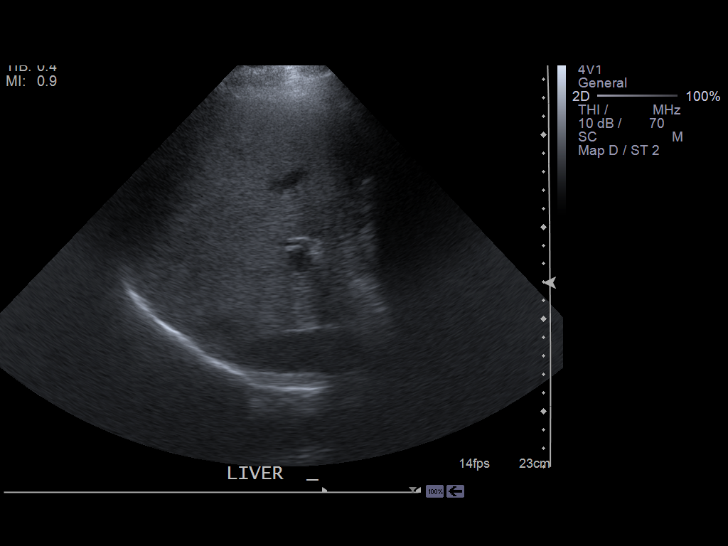
[im 17/31]
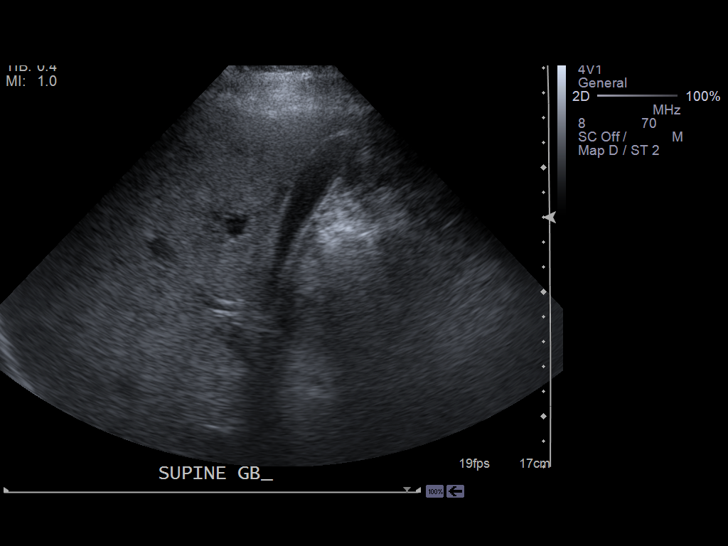
[im 19/31]
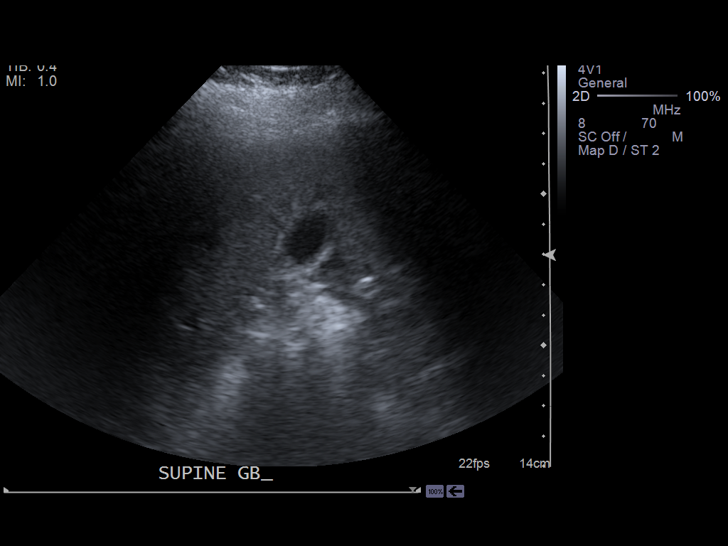
[im 21/31]
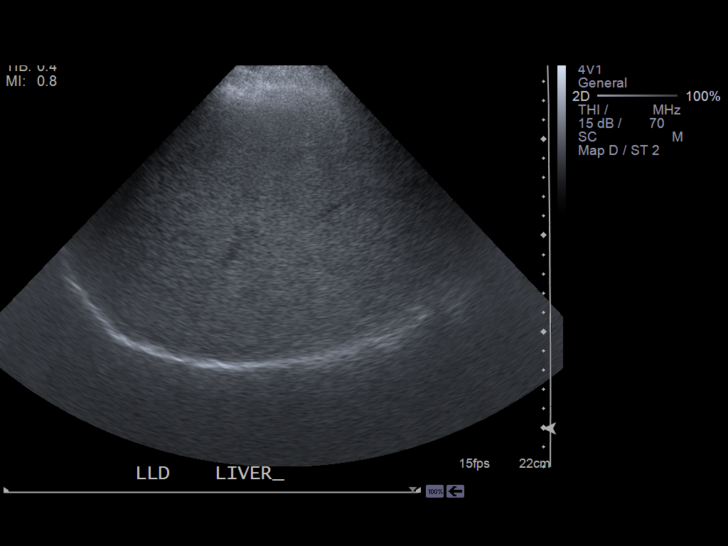
[im 23/31]
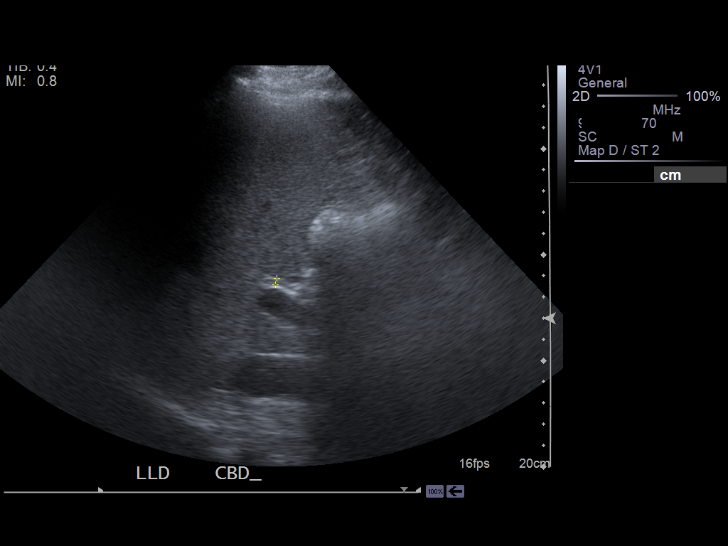
[im 26/31]
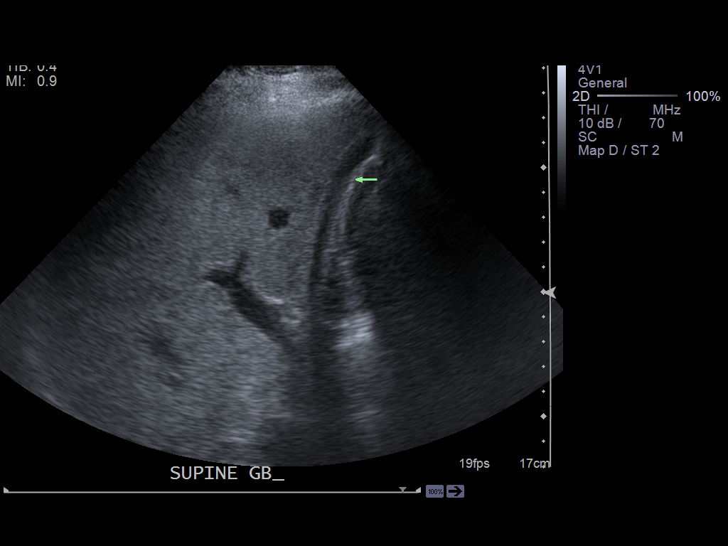
[im 28/31]
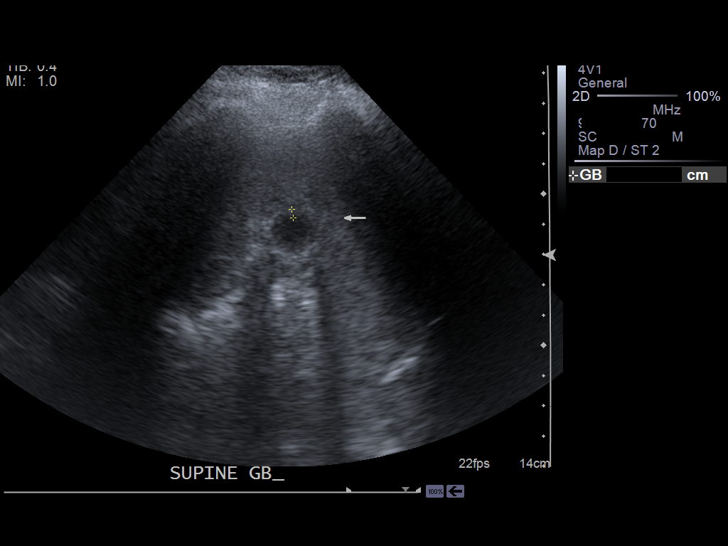
[im 31/31]
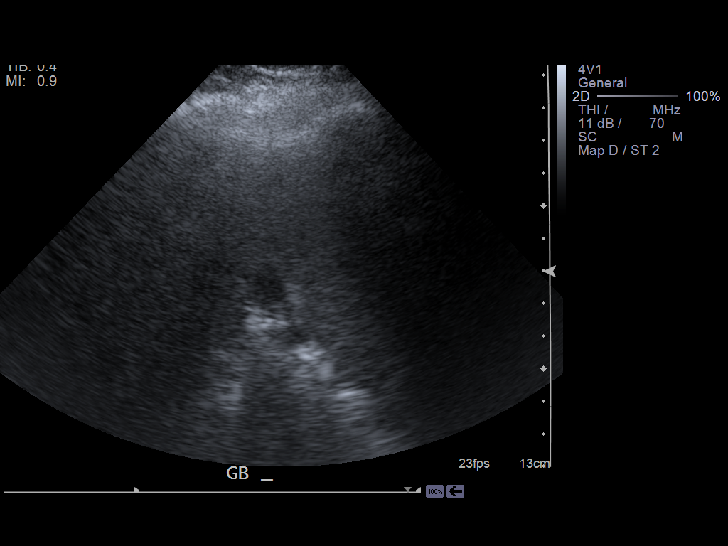

[14 of 25 positions shown; findings below may reference images not displayed]

FINDINGS: The liver is increased in echogenicity. The liver is without evidence of a
focal hepatic lesion.

There is no cholelithiasis or biliary sludge. There is no intra- or
extrahepatic biliary ductal dilatation. The common duct measures 3.1 mm in
maximal diameter. There is no gallbladder wall thickening, pericholecystic
fluid, or sonographic Murphy's sign.

The pancreas is not visualized secondary to overlying bowel gas.
IMPRESSION: 1. No cholelithiasis or sonographic evidence of acute cholecystitis.

2. Hepatic steatosis.

[REDACTED]

## 2015-05-03 ENCOUNTER — Ambulatory Visit: Payer: BLUE CROSS/BLUE SHIELD | Admitting: Family Medicine

## 2015-05-10 ENCOUNTER — Ambulatory Visit: Payer: BLUE CROSS/BLUE SHIELD | Admitting: Family Medicine

## 2015-05-17 ENCOUNTER — Ambulatory Visit (INDEPENDENT_AMBULATORY_CARE_PROVIDER_SITE_OTHER): Payer: BLUE CROSS/BLUE SHIELD | Admitting: Family Medicine

## 2015-05-17 ENCOUNTER — Encounter: Payer: Self-pay | Admitting: Family Medicine

## 2015-05-17 VITALS — BP 122/68 | HR 60 | Temp 97.9°F | Wt 211.5 lb

## 2015-05-17 DIAGNOSIS — E669 Obesity, unspecified: Secondary | ICD-10-CM | POA: Diagnosis not present

## 2015-05-17 DIAGNOSIS — Z23 Encounter for immunization: Secondary | ICD-10-CM

## 2015-05-17 DIAGNOSIS — E119 Type 2 diabetes mellitus without complications: Secondary | ICD-10-CM

## 2015-05-17 LAB — BASIC METABOLIC PANEL
BUN: 12 mg/dL (ref 6–23)
CHLORIDE: 102 meq/L (ref 96–112)
CO2: 30 mEq/L (ref 19–32)
CREATININE: 0.49 mg/dL (ref 0.40–1.50)
Calcium: 9.5 mg/dL (ref 8.4–10.5)
GFR: 194.55 mL/min (ref >60.00)
Glucose, Bld: 148 mg/dL — ABNORMAL HIGH (ref 70–99)
Potassium: 4 mEq/L (ref 3.5–5.1)
Sodium: 137 mEq/L (ref 135–145)

## 2015-05-17 LAB — MICROALBUMIN / CREATININE URINE RATIO
Creatinine,U: 47.2 mg/dL
Microalb Creat Ratio: 1.5 mg/g (ref 0.0–30.0)

## 2015-05-17 LAB — HEMOGLOBIN A1C: Hgb A1c MFr Bld: 6.9 % — ABNORMAL HIGH (ref 4.6–6.5)

## 2015-05-17 NOTE — Progress Notes (Signed)
BP 122/68 mmHg  Pulse 60  Temp(Src) 97.9 F (36.6 C) (Oral)  Wt 211 lb 8 oz (95.936 kg)   CC: 37mo f/u visit  Subjective:    Patient ID: Tim Hawkins, male    DOB: 1971-03-10, 44 y.o.   MRN: [id number]  HPI: Yasin Curatola is a 44 y.o. male presenting on 05/17/2015 for Follow-up   DM - regularly does check sugars 90-140 fasting. Compliant with antihyperglycemic regimen which includes: metformin 500mg  bid. Denies low sugars or hypoglycemic symptoms. Denies paresthesias. Last diabetic eye exam 06/2014.  Pneumovax: 10/2014.  Prevnar: not due yet.  Lab Results  Component Value Date   HGBA1C 6.7* 10/24/2014   Diabetic Foot Exam - Simple   Simple Foot Form  Diabetic Foot exam was performed with the following findings:  Yes 05/17/2015 11:55 AM  Visual Inspection  No deformities, no ulcerations, no other skin breakdown bilaterally:  Yes  Sensation Testing  Intact to touch and monofilament testing bilaterally:  Yes  Pulse Check  Posterior Tibialis and Dorsalis pulse intact bilaterally:  Yes  Comments      Obesity - walking more, drinking water, no weight loss noted. Walking regularly 10-20 min/day.  Body mass index is 32.17 kg/(m^2).   Relevant past medical, surgical, family and social history reviewed and updated as indicated. Interim medical history since our last visit reviewed. Allergies and medications reviewed and updated. Current Outpatient Prescriptions on File Prior to Visit  Medication Sig  . metFORMIN (GLUCOPHAGE) 500 MG tablet TAKE 1 TABLET TWICE A DAY WITH A MEAL   No current facility-administered medications on file prior to visit.    Review of Systems Per HPI unless specifically indicated in ROS section     Objective:    BP 122/68 mmHg  Pulse 60  Temp(Src) 97.9 F (36.6 C) (Oral)  Wt 211 lb 8 oz (95.936 kg)  Wt Readings from Last 3 Encounters:  05/17/15 211 lb 8 oz (95.936 kg)  12/20/14 209 lb (94.802 kg)  10/31/14 210 lb (95.255 kg)    Physical  Exam  Constitutional: He appears well-developed and well-nourished. No distress.  HENT:  Head: Normocephalic and atraumatic.  Right Ear: External ear normal.  Left Ear: External ear normal.  Nose: Nose normal.  Mouth/Throat: Oropharynx is clear and moist. No oropharyngeal exudate.  Eyes: Conjunctivae and EOM are normal. Pupils are equal, round, and reactive to light. No scleral icterus.  Neck: Normal range of motion. Neck supple.  Cardiovascular: Normal rate, regular rhythm, normal heart sounds and intact distal pulses.   No murmur heard. Pulmonary/Chest: Effort normal and breath sounds normal. No respiratory distress. He has no wheezes. He has no rales.  Musculoskeletal: He exhibits no edema.  See HPI for foot exam if done  Lymphadenopathy:    He has no cervical adenopathy.  Skin: Skin is warm and dry. No rash noted.  Psychiatric: He has a normal mood and affect.  Nursing note and vitals reviewed.  Results for orders placed or performed in visit on 10/31/14  HM DIABETES EYE EXAM  Result Value Ref Range   HM Diabetic Eye Exam No Retinopathy No Retinopathy      Assessment & Plan:   Problem List Items Addressed This Visit    Obesity, Class I, BMI 30-34.9    Discussed healthy diet and lifestyle changes to affect sustainable weight loss.      Diabetes type 2, controlled ([place]) - Primary    Chronic, stable. Check labs and microalb today. Refer to  diabetes education.      Relevant Orders   Ambulatory referral to diabetic education   Hemoglobin [id number]   Basic metabolic panel   Microalbumin / creatinine urine ratio    Other Visit Diagnoses    Need for influenza vaccination        Relevant Orders    Flu Vaccine QUAD 36+ mos PF IM (Fluarix & Fluzone Quad PF) (Completed)        Follow up plan: Return in about 6 months (around 11/14/2015), or as needed, for annual exam, prior fasting for blood work.

## 2015-05-17 NOTE — Assessment & Plan Note (Signed)
Chronic, stable. Check labs and microalb today. Refer to diabetes education.

## 2015-05-17 NOTE — Progress Notes (Signed)
Pre visit review using our clinic review tool, if applicable. No additional management support is needed unless otherwise documented below in the visit note. 

## 2015-05-17 NOTE — Patient Instructions (Addendum)
Flu shot today Blood work today microalbumin urine test today. Return in 6 months for physical We will refer you to diabetes education classes.  Diabetes Mellitus and Food It is important for you to manage your blood sugar (glucose) level. Your blood glucose level can be greatly affected by what you eat. Eating healthier foods in the appropriate amounts throughout the day at about the same time each day will help you control your blood glucose level. It can also help slow or prevent worsening of your diabetes mellitus. Healthy eating may even help you improve the level of your blood pressure and reach or maintain a healthy weight.  General recommendations for healthful eating and cooking habits include:  Eating meals and snacks regularly. Avoid going long periods of time without eating to lose weight.  Eating a diet that consists mainly of plant-based foods, such as fruits, vegetables, nuts, legumes, and whole grains.  Using low-heat cooking methods, such as baking, instead of high-heat cooking methods, such as deep frying. Work with your dietitian to make sure you understand how to use the Nutrition Facts information on food labels. HOW CAN FOOD AFFECT ME? Carbohydrates Carbohydrates affect your blood glucose level more than any other type of food. Your dietitian will help you determine how many carbohydrates to eat at each meal and teach you how to count carbohydrates. Counting carbohydrates is important to keep your blood glucose at a healthy level, especially if you are using insulin or taking certain medicines for diabetes mellitus. Alcohol Alcohol can cause sudden decreases in blood glucose (hypoglycemia), especially if you use insulin or take certain medicines for diabetes mellitus. Hypoglycemia can be a life-threatening condition. Symptoms of hypoglycemia (sleepiness, dizziness, and disorientation) are similar to symptoms of having too much alcohol.  If your health care provider has  given you approval to drink alcohol, do so in moderation and use the following guidelines:  Women should not have more than one drink per day, and men should not have more than two drinks per day. One drink is equal to:  12 oz of beer.  5 oz of wine.  1 oz of hard liquor.  Do not drink on an empty stomach.  Keep yourself hydrated. Have water, diet soda, or unsweetened iced tea.  Regular soda, juice, and other mixers might contain a lot of carbohydrates and should be counted. WHAT FOODS ARE NOT RECOMMENDED? As you make food choices, it is important to remember that all foods are not the same. Some foods have fewer nutrients per serving than other foods, even though they might have the same number of calories or carbohydrates. It is difficult to get your body what it needs when you eat foods with fewer nutrients. Examples of foods that you should avoid that are high in calories and carbohydrates but low in nutrients include:  Trans fats (most processed foods list trans fats on the Nutrition Facts label).  Regular soda.  Juice.  Candy.  Sweets, such as cake, pie, doughnuts, and cookies.  Fried foods. WHAT FOODS CAN I EAT? Eat nutrient-rich foods, which will nourish your body and keep you healthy. The food you should eat also will depend on several factors, including:  The calories you need.  The medicines you take.  Your weight.  Your blood glucose level.  Your blood pressure level.  Your cholesterol level. You should eat a variety of foods, including:  Protein.  Lean cuts of meat.  Proteins low in saturated fats, such as fish, egg whites,  and beans. Avoid processed meats.  Fruits and vegetables.  Fruits and vegetables that may help control blood glucose levels, such as apples, mangoes, and yams.  Dairy products.  Choose fat-free or low-fat dairy products, such as milk, yogurt, and cheese.  Grains, bread, pasta, and rice.  Choose whole grain products, such  as multigrain bread, whole oats, and brown rice. These foods may help control blood pressure.  Fats.  Foods containing healthful fats, such as nuts, avocado, olive oil, canola oil, and fish. DOES EVERYONE WITH DIABETES MELLITUS HAVE THE SAME MEAL PLAN? Because every person with diabetes mellitus is different, there is not one meal plan that works for everyone. It is very important that you meet with a dietitian who will help you create a meal plan that is just right for you.   This information is not intended to replace advice given to you by your health care provider. Make sure you discuss any questions you have with your health care provider.   Document Released: 03/12/2005 Document Revised: 07/06/2014 Document Reviewed: 05/12/2013 Elsevier Interactive Patient Education [employer].

## 2015-05-17 NOTE — Assessment & Plan Note (Signed)
Discussed healthy diet and lifestyle changes to affect sustainable weight loss  

## 2015-05-20 ENCOUNTER — Encounter: Payer: Self-pay | Admitting: *Deleted

## 2015-06-20 ENCOUNTER — Encounter: Payer: BLUE CROSS/BLUE SHIELD | Attending: Family Medicine | Admitting: *Deleted

## 2015-06-20 ENCOUNTER — Encounter: Payer: Self-pay | Admitting: *Deleted

## 2015-06-20 VITALS — BP 130/74 | Ht 68.0 in | Wt 212.6 lb

## 2015-06-20 DIAGNOSIS — E669 Obesity, unspecified: Secondary | ICD-10-CM | POA: Insufficient documentation

## 2015-06-20 DIAGNOSIS — E119 Type 2 diabetes mellitus without complications: Secondary | ICD-10-CM

## 2015-06-20 NOTE — Patient Instructions (Addendum)
Check blood sugars 1 x day before breakfast or 2 hrs after supper every day Exercise: Begin walking   for  15 minutes 3  days a week and gradually increase to 30 minutes 5 x week Eat 3 meals day,   2  snacks a day Space meals 4-6 hours apart Quit  smoking Bring blood sugar records to the next class

## 2015-06-20 NOTE — Progress Notes (Signed)
Diabetes Self-Management Education  Visit Type: First/Initial  Appt. Start Time: 1045  Appt. End Time: [id number]  06/20/2015  [name], identified by name and date of birth, is a 44 y.o. male with a diagnosis of Diabetes: Type 2.   ASSESSMENT  Blood pressure 130/74, height 5\' 8"  (1.727 m), weight 212 lb 9.6 oz (96.435 kg). Body mass index is 32.33 kg/(m^2).      Diabetes Self-Management Education - 06/20/15 1226    Visit Information   Visit Type First/Initial   Initial Visit   Diabetes Type Type 2   Are you currently following a meal plan? Yes   What type of meal plan do you follow? "More conscious about what I eat"   Are you taking your medications as prescribed? Yes   Date Diagnosed 2014   Health Coping   How would you rate your overall health? Fair   Psychosocial Assessment   Patient Belief/Attitude about Diabetes Motivated to manage diabetes   Self-care barriers None   Self-management support Family;Doctor's office   Patient Concerns Nutrition/Meal planning;Weight Control;Glycemic Control;Medication;Healthy Lifestyle   Special Needs None   Preferred Learning Style Visual;Hands on   Learning Readiness Change in progress   How often do you need to have someone help you when you read instructions, pamphlets, or other written materials from your doctor or pharmacy? 1 - Never   What is the last grade level you completed in school? [id number]   Complications   Last HgB A1C per patient/outside source 6.9 %  05/17/15   How often do you check your blood sugar? 1-2 times/day   Fasting Blood glucose range (mg/dL) 70-129;130-179  FBG's average 130's mg/dL   Have you had a dilated eye exam in the past 12 months? Yes   Have you had a dental exam in the past 12 months? Yes   Are you checking your feet? Yes   How many days per week are you checking your feet? 6   Dietary Intake   Breakfast cereal and milk; English muffin with cream cheese   Snack (morning) banana or apple   Lunch [place] or chicken sandwich with popcorn   Snack (afternoon) fresh vegetables, yogurt   Dinner meat and vegetables; soup; salad   Beverage(s) water, diet soda, unsweetened tea   Exercise   Exercise Type ADL's   Patient Education   Previous Diabetes Education No   Disease state  Definition of diabetes, type 1 and 2, and the diagnosis of diabetes   Nutrition management  Role of diet in the treatment of diabetes and the relationship between the three main macronutrients and blood glucose level;Carbohydrate counting   Physical activity and exercise  Role of exercise on diabetes management, blood pressure control and cardiac health.   Medications Reviewed patients medication for diabetes, action, purpose, timing of dose and side effects.   Monitoring Purpose and frequency of SMBG.;Identified appropriate SMBG and/or A1C goals.   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 Expect positive outcomes      Individualized Plan for Diabetes Self-Management Training:   Learning Objective:  Patient  will have a greater understanding of diabetes self-management. Patient education plan is to attend individual and/or group sessions per assessed needs and concerns.   Plan:   Patient Instructions  Check blood sugars 1 x day before breakfast or 2 hrs after supper every day Exercise: Begin walking   for  15 minutes 3  days a week and gradually increase to 30 minutes 5 x week Eat 3 meals day,   2  snacks a day Space meals 4-6  hours apart Quit  smoking Bring blood sugar records to the next class  Expected Outcomes:  Demonstrated interest in learning. Expect positive outcomes  Education material provided:  General Meal Planning Guidelines Simple Meal Plan  If problems or questions, patient to contact team via:   Johny Drilling, RN, CCM, CDE [phone]  Future DSME appointment:  Pt to check his work schedule in January and call back for classes.

## 2015-07-25 ENCOUNTER — Telehealth: Payer: Self-pay | Admitting: *Deleted

## 2015-07-25 NOTE — Telephone Encounter (Signed)
Phone call to follow up with diabetes classes. He needed to check his January work schedule. Pt to begin series beginning Aug 08, 2015.

## 2015-08-07 ENCOUNTER — Encounter: Payer: Self-pay | Admitting: Internal Medicine

## 2015-08-07 ENCOUNTER — Ambulatory Visit (INDEPENDENT_AMBULATORY_CARE_PROVIDER_SITE_OTHER): Payer: BLUE CROSS/BLUE SHIELD | Admitting: Internal Medicine

## 2015-08-07 VITALS — BP 138/72 | HR 90 | Temp 98.4°F | Wt 213.0 lb

## 2015-08-07 DIAGNOSIS — H9201 Otalgia, right ear: Secondary | ICD-10-CM | POA: Diagnosis not present

## 2015-08-07 MED ORDER — AMOXICILLIN 500 MG PO CAPS: 500.0000 mg | ORAL_CAPSULE | Freq: Three times a day (TID) | ORAL | Status: DC

## 2015-08-07 NOTE — Patient Instructions (Signed)
Earache An earache, also called otalgia, can be caused by many things. Pain from an earache can be sharp, dull, or burning. The pain may be temporary or constant. Earaches can be caused by problems with the ear, such as infection in either the middle ear or the ear canal, injury, impacted ear wax, middle ear pressure, or a foreign body in the ear. Ear pain can also result from problems in other areas. This is called referred pain. For example, pain can come from a sore throat, a tooth infection, or problems with the jaw or the joint between the jaw and the skull (temporomandibular joint, or TMJ). The cause of an earache is not always easy to identify. Watchful waiting may be appropriate for some earaches until a clear cause of the pain can be found. HOME CARE INSTRUCTIONS Watch your condition for any changes. The following actions may help to lessen any discomfort that you are feeling:  Take medicines only as directed by your health care provider. This includes ear drops.  Apply ice to your outer ear to help reduce pain.  Put ice in a plastic bag.  Place a towel between your skin and the bag.  Leave the ice on for 20 minutes, 2-3 times per day.  Do not put anything in your ear other than medicine that is prescribed by your health care provider.  Try resting in an upright position instead of lying down. This may help to reduce pressure in the middle ear and relieve pain.  Chew gum if it helps to relieve your ear pain.  Control any allergies that you have.  Keep all follow-up visits as directed by your health care provider. This is important. SEEK MEDICAL CARE IF:  Your pain does not improve within 2 days.  You have a fever.  You have new or worsening symptoms. SEEK IMMEDIATE MEDICAL CARE IF:  You have a severe headache.  You have a stiff neck.  You have difficulty swallowing.  You have redness or swelling behind your ear.  You have drainage from your ear.  You have hearing  loss.  You feel dizzy.   This information is not intended to replace advice given to you by your health care provider. Make sure you discuss any questions you have with your health care provider.   Document Released: 01/31/2004 Document Revised: 07/06/2014 Document Reviewed: 01/14/2014 Elsevier Interactive Patient Education 2016 Elsevier Inc.  

## 2015-08-07 NOTE — Progress Notes (Signed)
Pre visit review using our clinic review tool, if applicable. No additional management support is needed unless otherwise documented below in the visit note. 

## 2015-08-07 NOTE — Progress Notes (Signed)
Subjective:    Patient ID: Tim Hawkins, male    DOB: 12/31/1970, 45 y.o.   MRN: [id number]  HPI  Pt presents to the clinic today with c/o right ear pain. This started 5 days ago. He denies decreased hearing or ringing in his ears. He denies runny nose, nasal congestion, sore throat, or cough. He has tried leftover Ciprodex gtts given to him by ENT. He denies fever, chills or body aches. He has not had sick contacts.  Review of Systems      Past Medical History  Diagnosis Date  . Obesity 08/30/2012  . Fatty liver 08/2012    hepatic steatosis per abd [place] at [facility]  . Diabetes type 2, controlled ([place]) 06/20/2013    DSME 05/2015    Current Outpatient Prescriptions  Medication Sig Dispense Refill  . CINNAMON PO Take 1 capsule by mouth daily.    . metFORMIN (GLUCOPHAGE) 500 MG tablet TAKE 1 TABLET TWICE A DAY WITH A MEAL 180 tablet 2  . Multiple Vitamin (ONE-A-DAY MENS PO) Take 1 tablet by mouth daily.    . Omega-3 Fatty Acids (FISH OIL PO) Take 1 capsule by mouth daily.    Marland Kitchen amoxicillin (AMOXIL) 500 MG capsule Take 1 capsule (500 mg total) by mouth 3 (three) times daily. 30 capsule 0   No current facility-administered medications for this visit.    No Known Allergies  Family History  Problem Relation Age of Onset  . Diabetes Father 4  . CAD Father 9    MI  . Cancer Neg Hx   . Stroke Neg Hx   . Diabetes Mother     Pre-diabetes    Social History   Social History  . Marital Status: Single    Spouse Name: N/A  . Number of Children: N/A  . Years of Education: N/A   Occupational History  . Not on file.   Social History Main Topics  . Smoking status: Former Smoker -- 0.25 packs/day for 5 years    Types: Cigars    Quit date: 07/30/2012  . Smokeless tobacco: Current User     Comment: e cig  . Alcohol Use: Yes     Comment: Rare 1-2 per month  . Drug Use: No  . Sexual Activity: Not on file   Other Topics Concern  . Not on file   Social History Narrative   Mother lives with him.  No pets   Occupation: truck Geophysicist/field seismologist   Activity: tries to walk regularly   Diet: good water, tries daily fruits/vegetables     Constitutional: Denies fever, malaise, fatigue, headache or abrupt weight changes.  HEENT: Pt reports ear pain. Denies eye pain, eye redness, ringing in the ears, wax buildup, runny nose, nasal congestion, bloody nose, or sore throat. Respiratory: Denies difficulty breathing, shortness of breath, cough or sputum production.   Cardiovascular: Denies chest pain, chest tightness, palpitations or swelling in the hands or feet.   No other specific complaints in a complete review of systems (except as listed in HPI above).  Objective:   Physical Exam  BP 138/72 mmHg  Pulse 90  Temp(Src) 98.4 F (36.9 C) (Oral)  Wt 213 lb (96.616 kg)  SpO2 97% Wt Readings from Last 3 Encounters:  08/07/15 213 lb (96.616 kg)  06/20/15 212 lb 9.6 oz (96.435 kg)  05/17/15 211 lb 8 oz (95.936 kg)    General: Appears his stated age, in NAD. HEENT: Head: normal shape and size, no sinus tenderness noted;  Right Ear: Tm's gray, flat and dull, eczema noted in ear canal;  Neck:  No adenopathy noted  BMET    Component Value Date/Time   NA 137 05/17/2015 1201   NA 138 08/27/2012 2036   K 4.0 05/17/2015 1201   K 4.0 08/27/2012 2036   CL 102 05/17/2015 1201   CL 105 08/27/2012 2036   CO2 30 05/17/2015 1201   CO2 31 08/27/2012 2036   GLUCOSE 148* 05/17/2015 1201   GLUCOSE 184* 08/27/2012 2036   BUN 12 05/17/2015 1201   BUN 11 08/27/2012 2036   CREATININE 0.49 05/17/2015 1201   CREATININE 0.73 08/27/2012 2036   CALCIUM 9.5 05/17/2015 1201   CALCIUM 9.0 08/27/2012 2036   GFRNONAA >60 08/27/2012 2036   GFRAA >60 08/27/2012 2036    Lipid Panel     Component Value Date/Time   CHOL 146 10/24/2014 1107   TRIG 153.0* 10/24/2014 1107   HDL 30.60* 10/24/2014 1107   CHOLHDL 5 10/24/2014 1107   VLDL 30.6 10/24/2014 1107   LDLCALC 85 10/24/2014 1107     CBC    Component Value Date/Time   WBC 12.6* 08/27/2012 2036   RBC 4.70 08/27/2012 2036   HGB 13.3 08/27/2012 2036   HCT 40.2 08/27/2012 2036   PLT 268 08/27/2012 2036   MCV 86 08/27/2012 2036   MCH 28.4 08/27/2012 2036   MCHC 33.1 08/27/2012 2036   RDW 13.7 08/27/2012 2036    Hgb A1C Lab Results  Component Value Date   HGBA1C 6.9* 05/17/2015         Assessment & Plan:  Otalgia, right ear:  Could be infectious Will cover with Amoxil 500 mg TID x 10 days Ibuprofen as needed for pain  RTC as needed or if symptoms persist or worsen

## 2015-08-08 ENCOUNTER — Encounter: Payer: Self-pay | Admitting: Dietician

## 2015-08-08 ENCOUNTER — Encounter: Payer: BLUE CROSS/BLUE SHIELD | Attending: Family Medicine | Admitting: Dietician

## 2015-08-08 VITALS — Wt 215.9 lb

## 2015-08-08 DIAGNOSIS — E669 Obesity, unspecified: Secondary | ICD-10-CM | POA: Insufficient documentation

## 2015-08-08 DIAGNOSIS — E119 Type 2 diabetes mellitus without complications: Secondary | ICD-10-CM

## 2015-08-08 NOTE — Progress Notes (Signed)

## 2015-08-15 ENCOUNTER — Encounter: Payer: Self-pay | Admitting: *Deleted

## 2015-08-15 ENCOUNTER — Encounter: Payer: BLUE CROSS/BLUE SHIELD | Admitting: *Deleted

## 2015-08-15 VITALS — Wt 213.7 lb

## 2015-08-15 DIAGNOSIS — E119 Type 2 diabetes mellitus without complications: Secondary | ICD-10-CM

## 2015-08-15 DIAGNOSIS — E669 Obesity, unspecified: Secondary | ICD-10-CM | POA: Diagnosis not present

## 2015-08-15 NOTE — Progress Notes (Signed)

## 2015-08-22 ENCOUNTER — Encounter: Payer: Self-pay | Admitting: Dietician

## 2015-08-22 ENCOUNTER — Encounter: Payer: BLUE CROSS/BLUE SHIELD | Admitting: Dietician

## 2015-08-22 VITALS — BP 134/72 | Ht 68.0 in | Wt 214.5 lb

## 2015-08-22 DIAGNOSIS — E119 Type 2 diabetes mellitus without complications: Secondary | ICD-10-CM

## 2015-08-22 DIAGNOSIS — E669 Obesity, unspecified: Secondary | ICD-10-CM | POA: Diagnosis not present

## 2015-08-22 NOTE — Progress Notes (Signed)
Appt. Start Time: 9:00am Appt. End Time: 12:00noon  Class 3 Psychosocial - identify DM as a source of stress; state the effects of stress on BG control; verbalize appropriate stress management techniques; identify personal stress issues   Nutritional Management - describe effects of food on blood glucose; identify sources of carbohydrate, protein and fat; verbalize the importance of balance meals in controlling blood glucose; identify meals as well balanced or not; estimate servings of carbohydrate from menus; use food labels to identify servings size, content of carbohydrate, fiber, protein, fat, saturated fat and sodium; recognize food sources of fat, saturated fat, trans fat, sodium and verbalize goals for intake; describe healthful appropriate food choices when dining out   Exercise - describe the effects of exercise on blood glucose and importance of regular exercise in controlling diabetes; state a plan for personal exercise; verbalize contraindications for exercise  Self-Monitoring - state importance of HBGM and demo procedure accurately; use HBGM results to effectively manage diabetes; identify importance of regular HbA1C testing and goals for results  Acute Complications/Sick Day Guidelines - recognize hyperglycemia and hypoglycemia with causes and effects; identify blood glucose results as high, low or in control; list steps in treating and preventing high and low blood glucose; state appropriate measure to manage blood glucose when ill (need for meds, HBGM plan, when to call physician, need for fluids)  Chronic Complications/Foot, Skin, Eye Dental Care - identify possible long-term complications of diabetes (retinopathy, neuropathy, nephropathy, cardiovascular disease, infections); explain steps in prevention and treatment of chronic complications; state importance of daily self-foot exams; describe how to examine feet and what to look for; explain appropriate eye and dental care  Lifestyle  Changes/Goals & Health/Community Resources - state benefits of making appropriate lifestyle changes; identify habits that need to change (meals, tobacco, alcohol); identify strategies to reduce risk factors for personal health; set goals for proper diabetes care; state need for and frequency of healthcare follow-up; describe appropriate community resources for good health (ADA, web sites, apps)   Teaching Materials Used: Class 3 Slide Packet Diabetes Stress Test Stress Management Tools Stress Poem Recipe/Menu Booklet Nutrition Prescription Fast Food Information Goal Setting Worksheet

## 2015-08-26 ENCOUNTER — Encounter: Payer: Self-pay | Admitting: Family Medicine

## 2015-08-26 ENCOUNTER — Encounter: Payer: Self-pay | Admitting: *Deleted

## 2015-08-26 ENCOUNTER — Ambulatory Visit (INDEPENDENT_AMBULATORY_CARE_PROVIDER_SITE_OTHER): Payer: BLUE CROSS/BLUE SHIELD | Admitting: Family Medicine

## 2015-08-26 ENCOUNTER — Telehealth: Payer: Self-pay | Admitting: Family Medicine

## 2015-08-26 VITALS — BP 118/78 | HR 88 | Temp 98.6°F | Wt 211.5 lb

## 2015-08-26 DIAGNOSIS — M1289 Other specific arthropathies, not elsewhere classified, multiple sites: Secondary | ICD-10-CM

## 2015-08-26 DIAGNOSIS — M1280 Other specific arthropathies, not elsewhere classified, unspecified site: Secondary | ICD-10-CM

## 2015-08-26 DIAGNOSIS — E119 Type 2 diabetes mellitus without complications: Secondary | ICD-10-CM | POA: Diagnosis not present

## 2015-08-26 HISTORY — DX: Other specific arthropathies, not elsewhere classified, unspecified site: M12.80

## 2015-08-26 NOTE — Telephone Encounter (Signed)
Patient was seen today.  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  Please call patient when note is ready.  Patient can pick it up on Tuesday.

## 2015-08-26 NOTE — Assessment & Plan Note (Signed)
Congratulated on healthy until now.

## 2015-08-26 NOTE — Assessment & Plan Note (Addendum)
Anticipate has developed transient viral arthritis after recent viral AOM - discussed likely benign and should improve with time. Update if persistent or recurrent symptoms for further evaluation including labwork.  Pt and GF agree with plan. This does not sound cardiac in nature.

## 2015-08-26 NOTE — Progress Notes (Signed)
Pre visit review using our clinic review tool, if applicable. No additional management support is needed unless otherwise documented below in the visit note. 

## 2015-08-26 NOTE — Progress Notes (Signed)
BP 118/78 mmHg  Pulse 88  Temp(Src) 98.6 F (37 C) (Oral)  Wt 211 lb 8 oz (95.936 kg)   CC: arthralgias   Subjective:    Patient ID: Tim Hawkins, male    DOB: 08-Oct-1970, 45 y.o.   MRN: [id number]  HPI: Tim Hawkins is a 45 y.o. male presenting on 08/26/2015 for Joint Pain   Presents with GF today.  5d h/o joint pain - started L neck, radiation to L shoulder the bilateral shoulders and wrists, spares elbows. Very uncomfortable yesterday. Currently with persistent left sided pain. Worried about heart. GF thinks he looks flushed as well. No known injury  Denies redness/swelling/warmth of joints No recent tick or mosquito bite Denies cough, URI sxs, ST, congestion, abdominal pain, nausea/vomiting/diarrhea, new rashes, chest pain or dizziness.  BP recently elevated 160/80s Tmax at home 99.5.   2 wks ago did have AOM treat with amox course.  Completed DMSE. Weight loss noted with healthy diet and activity changes. Now walking regularly at work.  Asks about vasectomy - will call when desires referral.  Relevant past medical, surgical, family and social history reviewed and updated as indicated. Interim medical history since our last visit reviewed. Allergies and medications reviewed and updated. Current Outpatient Prescriptions on File Prior to Visit  Medication Sig  . CINNAMON PO Take 1 capsule by mouth daily.  . metFORMIN (GLUCOPHAGE) 500 MG tablet TAKE 1 TABLET TWICE A DAY WITH A MEAL  . Multiple Vitamin (ONE-A-DAY MENS PO) Take 1 tablet by mouth daily.  . Omega-3 Fatty Acids (FISH OIL PO) Take 1 capsule by mouth daily.   No current facility-administered medications on file prior to visit.    Review of Systems Per HPI unless specifically indicated in ROS section     Objective:    BP 118/78 mmHg  Pulse 88  Temp(Src) 98.6 F (37 C) (Oral)  Wt 211 lb 8 oz (95.936 kg)  Wt Readings from Last 3 Encounters:  08/26/15 211 lb 8 oz (95.936 kg)  08/22/15 214 lb 8  oz (97.297 kg)  08/15/15 213 lb 11.2 oz (96.934 kg)    Physical Exam  Constitutional: He appears well-developed and well-nourished. No distress.  HENT:  Head: Normocephalic and atraumatic.  Right Ear: Hearing, tympanic membrane, external ear and ear canal normal.  Left Ear: Hearing, tympanic membrane, external ear and ear canal normal.  Nose: Right sinus exhibits no maxillary sinus tenderness and no frontal sinus tenderness. Left sinus exhibits no maxillary sinus tenderness and no frontal sinus tenderness.  Mouth/Throat: Uvula is midline, oropharynx is clear and moist and mucous membranes are normal. No oropharyngeal exudate.  Baseline L nasal passage narrowing  Cardiovascular: Normal rate, regular rhythm, normal heart sounds and intact distal pulses.   No murmur heard. Pulmonary/Chest: Effort normal and breath sounds normal. No respiratory distress. He has no wheezes. He has no rales.  Musculoskeletal: He exhibits no edema.  Tender to palpation bilateral lateral shoulders and left dorsal wrist and MCP joints without evident swelling, erythema or warmth. No pain with passive ROM of shoulders. Neck FROM  Skin: Skin is warm and dry. No rash noted.  Psychiatric: He has a normal mood and affect.  Nursing note and vitals reviewed.     Assessment & Plan:   Problem List Items Addressed This Visit    Transient arthritis - Primary    Anticipate has developed transient viral arthritis after recent viral AOM - discussed likely benign and should improve with time. Update if  persistent or recurrent symptoms for further evaluation including labwork.  Pt and GF agree with plan. This does not sound cardiac in nature.      Diabetes type 2, controlled ([place])    Congratulated on healthy until now.          Follow up plan: Return if symptoms worsen or fail to improve.

## 2015-08-26 NOTE — Patient Instructions (Signed)
I think you have a transient viral arthritis - should improve with time. Treat with ibuprofen 600mg  twice daily with meals.  Update if not improving with treatment, or recurrent symptoms. Let us know when you'd like to be referred for vasectomy.

## 2015-08-27 ENCOUNTER — Telehealth: Payer: Self-pay

## 2015-08-27 ENCOUNTER — Encounter: Payer: Self-pay | Admitting: *Deleted

## 2015-08-27 DIAGNOSIS — M1280 Other specific arthropathies, not elsewhere classified, unspecified site: Secondary | ICD-10-CM

## 2015-08-27 MED ORDER — TRAMADOL HCL 50 MG PO TABS: 25.0000 mg | ORAL_TABLET | Freq: Two times a day (BID) | ORAL | Status: DC | PRN

## 2015-08-27 NOTE — Telephone Encounter (Signed)
Pt was seen 08/26/15 and last night took ibuprofen 600 mg but did not sleep well due to both shoulders hurting. Now pain level is 7. Pt request med for pain or inflammation [facility].Please advise.

## 2015-08-27 NOTE — Telephone Encounter (Signed)
Patient states no improvement at all since yesterday. Lab appt scheduled and Rx called in as directed.

## 2015-08-27 NOTE — Telephone Encounter (Addendum)
Are we seeing slow improvement? If not, recommend come in for labs today or tomorrow  May try tramadol for pain, caution it can cause sedation (plz phone in).

## 2015-08-28 ENCOUNTER — Other Ambulatory Visit (INDEPENDENT_AMBULATORY_CARE_PROVIDER_SITE_OTHER): Payer: BLUE CROSS/BLUE SHIELD

## 2015-08-28 DIAGNOSIS — M1289 Other specific arthropathies, not elsewhere classified, multiple sites: Secondary | ICD-10-CM

## 2015-08-28 DIAGNOSIS — M1280 Other specific arthropathies, not elsewhere classified, unspecified site: Secondary | ICD-10-CM

## 2015-08-28 LAB — CBC WITH DIFFERENTIAL/PLATELET
BASOS ABS: 0 10*3/uL (ref 0.0–0.1)
Basophils Relative: 0.4 % (ref 0.0–3.0)
EOS PCT: 2.7 % (ref 0.0–5.0)
Eosinophils Absolute: 0.3 10*3/uL (ref 0.0–0.7)
HCT: 35.5 % — ABNORMAL LOW (ref 39.0–52.0)
HEMOGLOBIN: 12.2 g/dL — AB (ref 13.0–17.0)
Lymphocytes Relative: 31.6 % (ref 12.0–46.0)
Lymphs Abs: 3.4 10*3/uL (ref 0.7–4.0)
MCHC: 34.4 g/dL (ref 30.0–36.0)
MCV: 80.6 fl (ref 78.0–100.0)
MONO ABS: 0.8 10*3/uL (ref 0.1–1.0)
MONOS PCT: 7.6 % (ref 3.0–12.0)
Neutro Abs: 6.2 10*3/uL (ref 1.4–7.7)
Neutrophils Relative %: 57.7 % (ref 43.0–77.0)
Platelets: 239 10*3/uL (ref 150.0–400.0)
RBC: 4.41 Mil/uL (ref 4.22–5.81)
RDW: 14.2 % (ref 11.5–15.5)
WBC: 10.8 10*3/uL — AB (ref 4.0–10.5)

## 2015-08-28 LAB — SEDIMENTATION RATE: SED RATE: 55 mm/h — AB (ref 0–22)

## 2015-08-29 ENCOUNTER — Encounter: Payer: Self-pay | Admitting: *Deleted

## 2015-08-29 ENCOUNTER — Telehealth: Payer: Self-pay

## 2015-08-29 MED ORDER — PREDNISONE 20 MG PO TABS: ORAL_TABLET | ORAL | Status: DC

## 2015-08-29 NOTE — Telephone Encounter (Signed)
Ok to extend work note for rest of week. Recommend short prednisone course - best anti inflammatory. Don't take with ibuprofen. Will need to decrease carbs when on prednisone as it will raise his sugars If no better after this, return for office visit. How is tramadol helping pain? labwork showed mild inflammation only, no infection.

## 2015-08-29 NOTE — Telephone Encounter (Signed)
Pt left v/m requesting cb when lab results are available and pt wants to extend his out of work note;pt not sure how much longer should be out of work; pt was feeling better but last night pain in shoulders flaired up again. Pt seen 08/26/15. Pt has been taking the ibuprofen 600 mg bid.Not helping now. Pt request cb ASAP.

## 2015-08-29 NOTE — Telephone Encounter (Signed)
Patient notified and verbalized understanding. He said the tramadol helped some, but not completely. Work note extended and placed up front for pick up.

## 2015-09-25 ENCOUNTER — Other Ambulatory Visit: Payer: Self-pay | Admitting: Family Medicine

## 2015-09-25 LAB — HM DIABETES EYE EXAM

## 2015-10-29 ENCOUNTER — Telehealth: Payer: Self-pay

## 2015-10-29 DIAGNOSIS — Z3009 Encounter for other general counseling and advice on contraception: Secondary | ICD-10-CM

## 2015-10-29 NOTE — Telephone Encounter (Signed)
Pt request referral for vasectomy on any Tues or Wed. Pt discussed with Dr Darnell Level at 08/26/15 appt. Please advise.

## 2015-10-29 NOTE — Telephone Encounter (Signed)
referral placed

## 2015-10-30 NOTE — Telephone Encounter (Signed)
Called patient he prefers [facility], gave him their information and that they will call him directly to schedule.

## 2015-11-10 ENCOUNTER — Other Ambulatory Visit: Payer: Self-pay | Admitting: Family Medicine

## 2015-11-10 DIAGNOSIS — E785 Hyperlipidemia, unspecified: Secondary | ICD-10-CM

## 2015-11-10 DIAGNOSIS — E119 Type 2 diabetes mellitus without complications: Secondary | ICD-10-CM

## 2015-11-10 DIAGNOSIS — K760 Fatty (change of) liver, not elsewhere classified: Secondary | ICD-10-CM

## 2015-11-10 DIAGNOSIS — D649 Anemia, unspecified: Secondary | ICD-10-CM

## 2015-11-13 ENCOUNTER — Other Ambulatory Visit (INDEPENDENT_AMBULATORY_CARE_PROVIDER_SITE_OTHER): Payer: BLUE CROSS/BLUE SHIELD

## 2015-11-13 DIAGNOSIS — K760 Fatty (change of) liver, not elsewhere classified: Secondary | ICD-10-CM

## 2015-11-13 DIAGNOSIS — D649 Anemia, unspecified: Secondary | ICD-10-CM

## 2015-11-13 DIAGNOSIS — E119 Type 2 diabetes mellitus without complications: Secondary | ICD-10-CM | POA: Diagnosis not present

## 2015-11-13 DIAGNOSIS — E785 Hyperlipidemia, unspecified: Secondary | ICD-10-CM | POA: Diagnosis not present

## 2015-11-13 LAB — CBC WITH DIFFERENTIAL/PLATELET
BASOS PCT: 0.3 % (ref 0.0–3.0)
Basophils Absolute: 0 10*3/uL (ref 0.0–0.1)
EOS ABS: 0.3 10*3/uL (ref 0.0–0.7)
EOS PCT: 3.1 % (ref 0.0–5.0)
HCT: 39 % (ref 39.0–52.0)
HEMOGLOBIN: 13 g/dL (ref 13.0–17.0)
Lymphocytes Relative: 37.4 % (ref 12.0–46.0)
Lymphs Abs: 3.6 10*3/uL (ref 0.7–4.0)
MCHC: 33.3 g/dL (ref 30.0–36.0)
MCV: 82.1 fl (ref 78.0–100.0)
MONO ABS: 0.5 10*3/uL (ref 0.1–1.0)
Monocytes Relative: 5.5 % (ref 3.0–12.0)
NEUTROS ABS: 5.1 10*3/uL (ref 1.4–7.7)
Neutrophils Relative %: 53.7 % (ref 43.0–77.0)
PLATELETS: 269 10*3/uL (ref 150.0–400.0)
RBC: 4.75 Mil/uL (ref 4.22–5.81)
RDW: 14.7 % (ref 11.5–15.5)
WBC: 9.6 10*3/uL (ref 4.0–10.5)

## 2015-11-13 LAB — COMPREHENSIVE METABOLIC PANEL
ALT: 24 U/L (ref 0–53)
AST: 18 U/L (ref 0–37)
Albumin: 4.1 g/dL (ref 3.5–5.2)
Alkaline Phosphatase: 70 U/L (ref 39–117)
BILIRUBIN TOTAL: 0.3 mg/dL (ref 0.2–1.2)
BUN: 16 mg/dL (ref 6–23)
CALCIUM: 9.2 mg/dL (ref 8.4–10.5)
CHLORIDE: 105 meq/L (ref 96–112)
CO2: 27 meq/L (ref 19–32)
CREATININE: 0.48 mg/dL (ref 0.40–1.50)
GFR: 198.8 mL/min (ref >60.00)
GLUCOSE: 141 mg/dL — AB (ref 70–99)
Potassium: 4.6 mEq/L (ref 3.5–5.1)
SODIUM: 138 meq/L (ref 135–145)
Total Protein: 6.8 g/dL (ref 6.0–8.3)

## 2015-11-13 LAB — LIPID PANEL
CHOL/HDL RATIO: 6
Cholesterol: 153 mg/dL (ref 0–200)
HDL: 25.2 mg/dL — ABNORMAL LOW (ref >39.00)
LDL CALC: 95 mg/dL (ref 0–99)
NONHDL: 127.68
Triglycerides: 163 mg/dL — ABNORMAL HIGH (ref 0.0–149.0)
VLDL: 32.6 mg/dL (ref 0.0–40.0)

## 2015-11-13 LAB — MICROALBUMIN / CREATININE URINE RATIO
Creatinine,U: 38.9 mg/dL
Microalb Creat Ratio: 1.8 mg/g (ref 0.0–30.0)

## 2015-11-13 LAB — HEMOGLOBIN A1C: Hgb A1c MFr Bld: 7.1 % — ABNORMAL HIGH (ref 4.6–6.5)

## 2015-11-18 ENCOUNTER — Encounter: Payer: BLUE CROSS/BLUE SHIELD | Admitting: Family Medicine

## 2015-11-20 ENCOUNTER — Encounter: Payer: Self-pay | Admitting: Urology

## 2015-11-20 ENCOUNTER — Ambulatory Visit (INDEPENDENT_AMBULATORY_CARE_PROVIDER_SITE_OTHER): Payer: BLUE CROSS/BLUE SHIELD | Admitting: Family Medicine

## 2015-11-20 ENCOUNTER — Encounter: Payer: Self-pay | Admitting: Family Medicine

## 2015-11-20 ENCOUNTER — Ambulatory Visit (INDEPENDENT_AMBULATORY_CARE_PROVIDER_SITE_OTHER): Payer: BLUE CROSS/BLUE SHIELD | Admitting: Urology

## 2015-11-20 VITALS — BP 150/80 | HR 80 | Temp 98.2°F | Ht 68.0 in | Wt 213.0 lb

## 2015-11-20 VITALS — BP 127/76 | HR 67 | Ht 68.0 in | Wt 212.2 lb

## 2015-11-20 DIAGNOSIS — E785 Hyperlipidemia, unspecified: Secondary | ICD-10-CM

## 2015-11-20 DIAGNOSIS — M1289 Other specific arthropathies, not elsewhere classified, multiple sites: Secondary | ICD-10-CM

## 2015-11-20 DIAGNOSIS — E119 Type 2 diabetes mellitus without complications: Secondary | ICD-10-CM

## 2015-11-20 DIAGNOSIS — Z309 Encounter for contraceptive management, unspecified: Secondary | ICD-10-CM | POA: Diagnosis not present

## 2015-11-20 DIAGNOSIS — Z3009 Encounter for other general counseling and advice on contraception: Secondary | ICD-10-CM | POA: Insufficient documentation

## 2015-11-20 DIAGNOSIS — K760 Fatty (change of) liver, not elsewhere classified: Secondary | ICD-10-CM

## 2015-11-20 DIAGNOSIS — E669 Obesity, unspecified: Secondary | ICD-10-CM | POA: Diagnosis not present

## 2015-11-20 DIAGNOSIS — M1280 Other specific arthropathies, not elsewhere classified, unspecified site: Secondary | ICD-10-CM

## 2015-11-20 DIAGNOSIS — Z0000 Encounter for general adult medical examination without abnormal findings: Secondary | ICD-10-CM | POA: Diagnosis not present

## 2015-11-20 MED ORDER — DIAZEPAM 10 MG PO TABS: ORAL_TABLET | ORAL | Status: DC

## 2015-11-20 MED ORDER — GLUCOSE BLOOD VI STRP: 1.0000 | ORAL_STRIP | Freq: Every day | Status: DC

## 2015-11-20 NOTE — Assessment & Plan Note (Addendum)
Chronic, stable. Continue current regimen. Completed DSME. Patient unable to find which glucose meter/strip was covered by his insurance despite multiple attempts. My CMA Maudie Mercury was able to find one-touch was on formulary after ~82min phone call with insurance.

## 2015-11-20 NOTE — Progress Notes (Signed)
Pre visit review using our clinic review tool, if applicable. No additional management support is needed unless otherwise documented below in the visit note. 

## 2015-11-20 NOTE — Assessment & Plan Note (Signed)
Discussed healthy diet and lifestyle changes to affect sustainable weight loss  

## 2015-11-20 NOTE — Assessment & Plan Note (Signed)
Preventative protocols reviewed and updated unless pt declined. Discussed healthy diet and lifestyle.  

## 2015-11-20 NOTE — Patient Instructions (Addendum)
You could use clotrimazole (lotrimin) over the counter to feet as needed especially between 4th/5th toes to help prevent fungal infection.  You are doing well today.  Goal blood pressure is <140/90. Check at local pharmacy over next few weeks and let me know if consistently above goal.  Return in 5 months for diabetes follow up visit.   Health Maintenance, Male A healthy lifestyle and preventative care can promote health and wellness.  Maintain regular health, dental, and eye exams.  Eat a healthy diet. Foods like vegetables, fruits, whole grains, low-fat dairy products, and lean protein foods contain the nutrients you need and are low in calories. Decrease your intake of foods high in solid fats, added sugars, and salt. Get information about a proper diet from your health care provider, if necessary.  Regular physical exercise is one of the most important things you can do for your health. Most adults should get at least 150 minutes of moderate-intensity exercise (any activity that increases your heart rate and causes you to sweat) each week. In addition, most adults need muscle-strengthening exercises on 2 or more days a week.   Maintain a healthy weight. The body mass index (BMI) is a screening tool to identify possible weight problems. It provides an estimate of body fat based on height and weight. Your health care provider can find your BMI and can help you achieve or maintain a healthy weight. For males 20 years and older:  A BMI below 18.5 is considered underweight.  A BMI of 18.5 to 24.9 is normal.  A BMI of 25 to 29.9 is considered overweight.  A BMI of 30 and above is considered obese.  Maintain normal blood lipids and cholesterol by exercising and minimizing your intake of saturated fat. Eat a balanced diet with plenty of fruits and vegetables. Blood tests for lipids and cholesterol should begin at age 61 and be repeated every 5 years. If your lipid or cholesterol levels are high,  you are over age 80, or you are at high risk for heart disease, you may need your cholesterol levels checked more frequently.Ongoing high lipid and cholesterol levels should be treated with medicines if diet and exercise are not working.  If you smoke, find out from your health care provider how to quit. If you do not use tobacco, do not start.  Lung cancer screening is recommended for adults aged 43-80 years who are at high risk for developing lung cancer because of a history of smoking. A yearly low-dose CT scan of the lungs is recommended for people who have at least a 30-pack-year history of smoking and are current smokers or have quit within the past 15 years. A pack year of smoking is smoking an average of 1 pack of cigarettes a day for 1 year (for example, a 30-pack-year history of smoking could mean smoking 1 pack a day for 30 years or 2 packs a day for 15 years). Yearly screening should continue until the smoker has stopped smoking for at least 15 years. Yearly screening should be stopped for people who develop a health problem that would prevent them from having lung cancer treatment.  If you choose to drink alcohol, do not have more than 2 drinks per day. One drink is considered to be 12 oz (360 mL) of beer, 5 oz (150 mL) of wine, or 1.5 oz (45 mL) of liquor.  Avoid the use of street drugs. Do not share needles with anyone. Ask for help if you need  support or instructions about stopping the use of drugs.  High blood pressure causes heart disease and increases the risk of stroke. High blood pressure is more likely to develop in:  People who have blood pressure in the end of the normal range (100-139/85-89 mm Hg).  People who are overweight or obese.  People who are African American.  If you are 93-88 years of age, have your blood pressure checked every 3-5 years. If you are 48 years of age or older, have your blood pressure checked every year. You should have your blood pressure measured  twice--once when you are at a hospital or clinic, and once when you are not at a hospital or clinic. Record the average of the two measurements. To check your blood pressure when you are not at a hospital or clinic, you can use:  An automated blood pressure machine at a pharmacy.  A home blood pressure monitor.  If you are 71-16 years old, ask your health care provider if you should take aspirin to prevent heart disease.  Diabetes screening involves taking a blood sample to check your fasting blood sugar level. This should be done once every 3 years after age 33 if you are at a normal weight and without risk factors for diabetes. Testing should be considered at a younger age or be carried out more frequently if you are overweight and have at least 1 risk factor for diabetes.  Colorectal cancer can be detected and often prevented. Most routine colorectal cancer screening begins at the age of 51 and continues through age 20. However, your health care provider may recommend screening at an earlier age if you have risk factors for colon cancer. On a yearly basis, your health care provider may provide home test kits to check for hidden blood in the stool. A small camera at the end of a tube may be used to directly examine the colon (sigmoidoscopy or colonoscopy) to detect the earliest forms of colorectal cancer. Talk to your health care provider about this at age 88 when routine screening begins. A direct exam of the colon should be repeated every 5-10 years through age 32, unless early forms of precancerous polyps or small growths are found.  People who are at an increased risk for hepatitis B should be screened for this virus. You are considered at high risk for hepatitis B if:  You were born in a country where hepatitis B occurs often. Talk with your health care provider about which countries are considered high risk.  Your parents were born in a high-risk country and you have not received a shot to  protect against hepatitis B (hepatitis B vaccine).  You have HIV or AIDS.  You use needles to inject street drugs.  You live with, or have sex with, someone who has hepatitis B.  You are a man who has sex with other men (MSM).  You get hemodialysis treatment.  You take certain medicines for conditions like cancer, organ transplantation, and autoimmune conditions.  Hepatitis C blood testing is recommended for all people born from [birth year] through 1967 and any individual with known risk factors for hepatitis C.  Healthy men should no longer receive prostate-specific antigen (PSA) blood tests as part of routine cancer screening. Talk to your health care provider about prostate cancer screening.  Testicular cancer screening is not recommended for adolescents or adult males who have no symptoms. Screening includes self-exam, a health care provider exam, and other screening tests. Consult with your  health care provider about any symptoms you have or any concerns you have about testicular cancer.  Practice safe sex. Use condoms and avoid high-risk sexual practices to reduce the spread of sexually transmitted infections (STIs).  You should be screened for STIs, including gonorrhea and chlamydia if:  You are sexually active and are younger than 24 years.  You are older than 24 years, and your health care provider tells you that you are at risk for this type of infection.  Your sexual activity has changed since you were last screened, and you are at an increased risk for chlamydia or gonorrhea. Ask your health care provider if you are at risk.  If you are at risk of being infected with HIV, it is recommended that you take a prescription medicine daily to prevent HIV infection. This is called pre-exposure prophylaxis (PrEP). You are considered at risk if:  You are a man who has sex with other men (MSM).  You are a heterosexual man who is sexually active with multiple partners.  You take drugs by  injection.  You are sexually active with a partner who has HIV.  Talk with your health care provider about whether you are at high risk of being infected with HIV. If you choose to begin PrEP, you should first be tested for HIV. You should then be tested every 3 months for as long as you are taking PrEP.  Use sunscreen. Apply sunscreen liberally and repeatedly throughout the day. You should seek shade when your shadow is shorter than you. Protect yourself by wearing long sleeves, pants, a wide-brimmed hat, and sunglasses year round whenever you are outdoors.  Tell your health care provider of new moles or changes in moles, especially if there is a change in shape or color. Also, tell your health care provider if a mole is larger than the size of a pencil eraser.  A one-time screening for abdominal aortic aneurysm (AAA) and surgical repair of large AAAs by ultrasound is recommended for men aged 40-75 years who are current or former smokers.  Stay current with your vaccines (immunizations).   This information is not intended to replace advice given to you by your health care provider. Make sure you discuss any questions you have with your health care provider.   Document Released: 12/12/2007 Document Revised: 07/06/2014 Document Reviewed: 11/10/2010 Elsevier Interactive Patient Education [employer].

## 2015-11-20 NOTE — Assessment & Plan Note (Signed)
LFTs normal

## 2015-11-20 NOTE — Assessment & Plan Note (Signed)
Resolved. Unclear etiology.

## 2015-11-20 NOTE — Assessment & Plan Note (Signed)
Chronic, stable. Continue current regimen of diet control and fish oil.

## 2015-11-20 NOTE — Progress Notes (Signed)
11/20/2015 10:06 AM   Tim Hawkins [birth year] [id number]  Referring provider: Ria Bush, MD [address] [place],  [address]  Chief Complaint  Patient presents with  . VAS Consult    HPI: [name] is presents today as a referral for a vasectomy.  Patient has no children and does not wish to have any children.  Patient denies any history of chronic prostatitis, epididymitis, orchitis, or other genital pain.  Today, we discussed what the vas deferens is, where it is located, and its function. We reviewed the procedure for vasectomy, it's risks, benefits, alternatives, and likelihood of achieving his goals.   We discussed in detail the procedure, complications, and recovery as well as the need for clearance prior to unprotected intercourse. We discussed that vasectomy does not protect against sexually transmitted diseases. We discussed that this procedure does not result in immediate sterility and that they would need to use other forms of birth control until he has been cleared with a three month negative postvasectomy semen analyses.  I explained that the procedure is considered to be permanent and that attempts at reversal have varying degrees of success. These options include vasectomy reversal, sperm retrieval, and in vitro fertilization; these can be very expensive.   We discussed the chance of postvasectomy pain syndrome which occurs in less than 5% of patients. I explained to the patient that there is no treatment to resolve this chronic pain, and that if it developed I would not be able to help resolve the issue, but that surgery is generally not needed for correction.   I explained there have even been reports of systemic like illness associated with this chronic pain, and that there was no good cure. I explained that vasectomy it is not a 100% reliable form of birth control, and the risk of pregnancy after vasectomy is approximately 1 in  2000 men who had a negative postvasectomy semen analysis or rare non-motile sperm.  I explained that repeat vasectomy was necessary in less than 1% of vasectomy procedures when employing the type of technique that is performed in the office. I explained that he should refrain from ejaculation for approximately one week following vasectomy. I explained that there are other options for birth control which are permanent and non-permanent; we discussed these.  I explained the rates of surgical complications, such as symptomatic hematoma or infection, are low (1-2%) and vary with the surgeon's experience and criteria used to diagnose the complication.    PMH: Past Medical History  Diagnosis Date  . Obesity 08/30/2012  . Fatty liver 08/2012    hepatic steatosis per abd [place] at [facility]  . Diabetes type 2, controlled ([place]) 06/20/2013    DSME 05/2015    Surgical History: Past Surgical History  Procedure Laterality Date  . None      Home Medications:    Medication List       This list is accurate as of: 11/20/15 10:06 AM.  Always use your most recent med list.               CINNAMON PO  Take 1 capsule by mouth daily.     diazepam 10 MG tablet  Commonly known as:  VALIUM  Take 30 minutes prior to vasectomy     FISH OIL PO  Take 1 capsule by mouth daily.     metFORMIN 500 MG tablet  Commonly known as:  GLUCOPHAGE  TAKE 1 TABLET TWICE A DAY WITH  MEALS     ONE-A-DAY MENS PO  Take 1 tablet by mouth daily.     predniSONE 20 MG tablet  Commonly known as:  DELTASONE  Take two tablets daily for 3 days followed by one tablet daily for 3 days     traMADol 50 MG tablet  Commonly known as:  ULTRAM  Take 0.5-1 tablets (25-50 mg total) by mouth 2 (two) times daily as needed.        Allergies: No Known Allergies  Family History: Family History  Problem Relation Age of Onset  . Diabetes Father 22  . CAD Father 44    MI  . Cancer Neg Hx   . Stroke Neg Hx   . Diabetes Mother      Pre-diabetes  . Kidney disease Neg Hx   . Prostate cancer Neg Hx     Social History:  reports that he quit smoking about 3 years ago. His smoking use included Cigars. He uses smokeless tobacco. He reports that he drinks alcohol. He reports that he does not use illicit drugs.  ROS: UROLOGY Frequent Urination?: No Hard to postpone urination?: No Burning/pain with urination?: No Get up at night to urinate?: Yes Leakage of urine?: No Urine stream starts and stops?: No Trouble starting stream?: No Do you have to strain to urinate?: No Blood in urine?: No Urinary tract infection?: No Sexually transmitted disease?: No Injury to kidneys or bladder?: No Painful intercourse?: No Weak stream?: No Erection problems?: No Penile pain?: No  Gastrointestinal Nausea?: No Vomiting?: No Indigestion/heartburn?: No Diarrhea?: No Constipation?: No  Constitutional Fever: No Night sweats?: No Weight loss?: No Fatigue?: No  Skin Skin rash/lesions?: No Itching?: No  Eyes Blurred vision?: No Double vision?: No  Ears/Nose/Throat Sore throat?: No Sinus problems?: No  Hematologic/Lymphatic Swollen glands?: No Easy bruising?: No  Cardiovascular Leg swelling?: No Chest pain?: No  Respiratory Cough?: No Shortness of breath?: No  Endocrine Excessive thirst?: No  Musculoskeletal Back pain?: No Joint pain?: No  Neurological Headaches?: No Dizziness?: No  Psychologic Depression?: No Anxiety?: No  Physical Exam: BP 127/76 mmHg  Pulse 67  Ht 5\' 8"  (1.727 m)  Wt 212 lb 3.2 oz (96.253 kg)  BMI 32.27 kg/m2  Constitutional: Well nourished. Alert and oriented, No acute distress. HEENT: [place] AT, moist mucus membranes. Trachea midline, no masses. Cardiovascular: No clubbing, cyanosis, or edema. Respiratory: Normal respiratory effort, no increased work of breathing. GI: Abdomen is soft, non tender, non distended, no abdominal masses. Liver and spleen not palpable.  No hernias  appreciated.  Stool sample for occult testing is not indicated.   GU: No CVA tenderness.  No bladder fullness or masses.  Patient with circumcised phallus.  Urethral meatus is patent.  No penile discharge. No penile lesions or rashes. Scrotum without lesions, cysts, rashes and/or edema.  Testicles are located scrotally bilaterally. No masses are appreciated in the testicles. Left and right epididymis are normal. Rectal: Deferred.   Skin: No rashes, bruises or suspicious lesions. Lymph: No cervical or inguinal adenopathy. Neurologic: Grossly intact, no focal deficits, moving all 4 extremities. Psychiatric: Normal mood and affect.  Laboratory Data: Lab Results  Component Value Date   WBC 9.6 11/13/2015   HGB 13.0 11/13/2015   HCT 39.0 11/13/2015   MCV 82.1 11/13/2015   PLT 269.0 11/13/2015    Lab Results  Component Value Date   CREATININE 0.48 11/13/2015     Lab Results  Component Value Date   HGBA1C 7.1* 11/13/2015  Lab Results  Component Value Date   TSH 0.69 10/24/2014       Component Value Date/Time   CHOL 153 11/13/2015 0753   HDL 25.20* 11/13/2015 0753   CHOLHDL 6 11/13/2015 0753   VLDL 32.6 11/13/2015 0753   LDLCALC 95 11/13/2015 0753    Lab Results  Component Value Date   AST 18 11/13/2015   Lab Results  Component Value Date   ALT 24 11/13/2015    Assessment & Plan:    1. Vasectomy consult:  Patient has read and signed the consent.  He is given the pre-op vasectomy instruction sheet.  He is prescribed Valium 10 mg and instructed to take it 30 minutes prior to his vasectomy appointment.  He is to have a driver.  I reemphasized to the patient that this is to be considered a permanent form of birth control, that he is to use an alternative form of birth control until we receive the 3 months specimen and it is cleared of sperm and that this will not prevent STI's.  His questions are answered to his satisfaction and he understands the risks and is willing to  proceed with the vasectomy.  He will schedule his vasectomy.    Return for schedule vasectomy.  These notes generated with voice recognition software. I apologize for typographical errors.  Zara Council, [facility] [address] [place], [place] [address] [phone]

## 2015-11-20 NOTE — Progress Notes (Signed)
BP 150/80 mmHg  Pulse 80  Temp(Src) 98.2 F (36.8 C) (Oral)  Ht 5\' 8"  (1.727 m)  Wt 213 lb (96.616 kg)  BMI 32.39 kg/m2   CC: CPE  Subjective:    Patient ID: Tim Hawkins, male    DOB: [birth year], 45 y.o.   MRN: [id number]  HPI: Tim Hawkins is a 45 y.o. male presenting on 11/20/2015 for Annual Exam   Transient arthralgias improved.  Diabetic Foot Exam - Simple   Simple Foot Form  Diabetic Foot exam was performed with the following findings:  Yes 11/20/2015 12:28 PM  Visual Inspection  See comments:  Yes  Sensation Testing  Intact to touch and monofilament testing bilaterally:  Yes  Pulse Check  Posterior Tibialis and Dorsalis pulse intact bilaterally:  Yes  Comments  Dry skin. onychomycotic nails throughout       Preventative: Flu shot yearly Pneumovax 2016 Tdap 2016 Seat belt use discussed Sunscreen use discussed, no changing moles on skin.   Lives with [name] significant other. No pets Occupation: truck Geophysicist/field seismologist Activity: tries to walk regularly 30 min  Diet: good water, tries daily fruits/vegetables   Relevant past medical, surgical, family and social history reviewed and updated as indicated. Interim medical history since our last visit reviewed. Allergies and medications reviewed and updated. Current Outpatient Prescriptions on File Prior to Visit  Medication Sig  . CINNAMON PO Take 1 capsule by mouth daily.  . metFORMIN (GLUCOPHAGE) 500 MG tablet TAKE 1 TABLET TWICE A DAY WITH MEALS  . Multiple Vitamin (ONE-A-DAY MENS PO) Take 1 tablet by mouth daily.  . Omega-3 Fatty Acids (FISH OIL PO) Take 1 capsule by mouth daily.   No current facility-administered medications on file prior to visit.    Review of Systems  Constitutional: Negative for fever, chills, activity change, appetite change, fatigue and unexpected weight change.  HENT: Negative for hearing loss.   Eyes: Negative for visual disturbance.  Respiratory: Negative for cough, chest tightness,  shortness of breath and wheezing.   Cardiovascular: Negative for chest pain, palpitations and leg swelling.  Gastrointestinal: Negative for nausea, vomiting, abdominal pain, diarrhea, constipation, blood in stool and abdominal distention.  Genitourinary: Negative for hematuria and difficulty urinating.  Musculoskeletal: Negative for myalgias, arthralgias and neck pain.  Skin: Negative for rash.  Neurological: Negative for dizziness, seizures, syncope and headaches.  Hematological: Negative for adenopathy. Does not bruise/bleed easily.  Psychiatric/Behavioral: Negative for dysphoric mood. The patient is not nervous/anxious.    Per HPI unless specifically indicated in ROS section     Objective:    BP 150/80 mmHg  Pulse 80  Temp(Src) 98.2 F (36.8 C) (Oral)  Ht 5\' 8"  (1.727 m)  Wt 213 lb (96.616 kg)  BMI 32.39 kg/m2  Wt Readings from Last 3 Encounters:  11/20/15 213 lb (96.616 kg)  11/20/15 212 lb 3.2 oz (96.253 kg)  08/26/15 211 lb 8 oz (95.936 kg)    Physical Exam  Constitutional: He is oriented to person, place, and time. He appears well-developed and well-nourished. No distress.  HENT:  Head: Normocephalic and atraumatic.  Right Ear: Hearing, tympanic membrane, external ear and ear canal normal.  Left Ear: Hearing, tympanic membrane, external ear and ear canal normal.  Nose: Nose normal.  Mouth/Throat: Uvula is midline, oropharynx is clear and moist and mucous membranes are normal. No oropharyngeal exudate, posterior oropharyngeal edema or posterior oropharyngeal erythema.  Eyes: Conjunctivae and EOM are normal. Pupils are equal, round, and reactive to light. No scleral icterus.  Neck: Normal range of motion. Neck supple. No thyromegaly present.  Cardiovascular: Normal rate, regular rhythm, normal heart sounds and intact distal pulses.   No murmur heard. Pulses:      Radial pulses are 2+ on the right side, and 2+ on the left side.  Pulmonary/Chest: Effort normal and breath  sounds normal. No respiratory distress. He has no wheezes. He has no rales.  Abdominal: Soft. Bowel sounds are normal. He exhibits no distension and no mass. There is no tenderness. There is no rebound and no guarding.  Musculoskeletal: Normal range of motion. He exhibits no edema.  Lymphadenopathy:    He has no cervical adenopathy.  Neurological: He is alert and oriented to person, place, and time.  CN grossly intact, station and gait intact  Skin: Skin is warm and dry. No rash noted.  Psychiatric: He has a normal mood and affect. His behavior is normal. Judgment and thought content normal.  Nursing note and vitals reviewed.  Results for orders placed or performed in visit on 11/20/15  HM DIABETES EYE EXAM  Result Value Ref Range   HM Diabetic Eye Exam No Retinopathy No Retinopathy      Assessment & Plan:   Problem List Items Addressed This Visit    Obesity, Class I, BMI 30-34.9    Discussed healthy diet and lifestyle changes to affect sustainable weight loss      Hepatic steatosis    LFTs normal.      Diabetes type 2, controlled (HCC)    Chronic, stable. Continue current regimen. Completed DSME. Patient unable to find which glucose meter/strip was covered by his insurance despite multiple attempts. My CMA Maudie Mercury was able to find one-touch was on formulary after ~79min phone call with insurance.       Dyslipidemia    Chronic, stable. Continue current regimen of diet control and fish oil.      Health maintenance examination - Primary    Preventative protocols reviewed and updated unless pt declined. Discussed healthy diet and lifestyle.       Transient arthritis    Resolved. Unclear etiology.           Follow up plan: Return in about 5 months (around 04/21/2016), or as needed, for follow up visit.  Ria Bush, MD

## 2015-12-28 HISTORY — PX: VASECTOMY: SHX75

## 2016-01-15 ENCOUNTER — Encounter: Payer: BLUE CROSS/BLUE SHIELD | Admitting: Urology

## 2016-01-16 ENCOUNTER — Encounter: Payer: Self-pay | Admitting: Urology

## 2016-01-16 ENCOUNTER — Ambulatory Visit (INDEPENDENT_AMBULATORY_CARE_PROVIDER_SITE_OTHER): Payer: BLUE CROSS/BLUE SHIELD | Admitting: Urology

## 2016-01-16 VITALS — BP 159/80 | HR 74 | Ht 68.0 in | Wt 208.0 lb

## 2016-01-16 DIAGNOSIS — Z302 Encounter for sterilization: Secondary | ICD-10-CM

## 2016-01-16 NOTE — Progress Notes (Signed)
01/16/2016 12:02 PM   Tim Hawkins 1971-05-26 [id number]  Referring provider: Ria Bush, MD [address] [place], [place] [address]  Chief Complaint  Patient presents with  . VAS    HPI: 45 year old male who presents today for scheduled vasectomy. Risks and benefits were previously reviewed in detail. He has no additional questions today.   PMH: Past Medical History  Diagnosis Date  . Obesity 08/30/2012  . Fatty liver 08/2012    hepatic steatosis per abd [place] at [facility]  . Diabetes type 2, controlled ([place]) 06/20/2013    DSME 05/2015    Surgical History: Past Surgical History  Procedure Laterality Date  . None      Home Medications:    Medication List       This list is accurate as of: 01/16/16 12:02 PM.  Always use your most recent med list.               CINNAMON PO  Take 1 capsule by mouth daily.     diazepam 10 MG tablet  Commonly known as:  VALIUM  Take 30 minutes prior to vasectomy     FISH OIL PO  Take 1 capsule by mouth daily.     glucose blood test strip  1 each by Other route daily. Use to check sugar once daily Dx: E11.9 **One Touch Ultra Mini Blue**     metFORMIN 500 MG tablet  Commonly known as:  GLUCOPHAGE  TAKE 1 TABLET TWICE A DAY WITH MEALS     ONE-A-DAY MENS PO  Take 1 tablet by mouth daily.        Allergies: No Known Allergies  Family History: Family History  Problem Relation Age of Onset  . Diabetes Father 32  . CAD Father 40    MI  . Cancer Neg Hx   . Stroke Neg Hx   . Diabetes Mother     Pre-diabetes  . Kidney disease Neg Hx   . Prostate cancer Neg Hx     Social History:  reports that he quit smoking about 3 years ago. His smoking use included Cigars. He uses smokeless tobacco. He reports that he drinks alcohol. He reports that he does not use illicit drugs.   Physical Exam: BP 159/80 mmHg  Pulse 74  Ht 5\' 8"  (1.727 m)  Wt 208 lb (94.348 kg)  BMI 31.63 kg/m2  Constitutional:  Alert and  oriented, No acute distress. HEENT: [place] AT, moist mucus membranes.  Trachea midline, no masses. Cardiovascular: No clubbing, cyanosis, or edema. Respiratory: Normal respiratory effort, no increased work of breathing. GI: Abdomen is soft, nontender, nondistended, no abdominal masses GU: No CVA tenderness. Bilateral descended testicles with vasa easily palpable bilaterally. Skin: No rashes, bruises or suspicious lesions. Neurologic: Grossly intact, no focal deficits, moving all 4 extremities. Psychiatric: Normal mood and affect.  Bilateral Vasectomy Procedure  Pre-Procedure: - Patient's scrotum was prepped and draped for vasectomy. - The vas was palpated through the scrotal skin on the left. - 1% Xylocaine was injected into the skin and surrounding tissue for placement  - In a similar manner, the vas on the right was identified, anesthetized, and stabilized.  Procedure: - A bladeless technique was used to open the overlying skin (sharp dissection) - The left vas was isolated and brought up through the incision exposing that structure. - Bleeding points were cauterized as they occurred. - The vas was free from the surrounding structures and brought to the view. - A  segment was positioned for placement with a hemostat. - A second hemostat was placed and a small segment between the two hemostats and was removed for inspection. - Each end of the transected vas lumen was fulgurated/ obliterated using needlepoint electrocautery -A fascial interposition was performed on testicular end of the vas using #3-0 chromic suture -The same procedure was performed on the right. - A single suture of #3-0 chromic catgut was used to close each lateral scrotal skin incision - A dressing was applied.  Post-Procedure: - Patient was instructed in care of the operative area - A specimen is to be delivered in 12 weeks   -Another form of contraception is to be used until      Assessment & Plan:    1.  Encounter for vasectomy S/p uncomplicated vasectomy Post op as above  Hollice Espy, MD  [facility] [address] [place], [place] [address] [phone]

## 2016-01-19 ENCOUNTER — Encounter: Payer: Self-pay | Admitting: Family Medicine

## 2016-01-21 ENCOUNTER — Telehealth: Payer: Self-pay

## 2016-01-21 NOTE — Telephone Encounter (Signed)
The pt called complaining of right side testicular pain after Vasectomy on Thursday, July 20th. He reports no swelling or bruising. Pt currently taking Tylenol (2) tablets bid. I spoke w/ Dr. Alyson Ingles and he informed me to have the pt to take  Ibuprofen 800MG   Up three times a day alternating between the Tylenol. If symptoms doesn't improve in the next couple of days to call back.

## 2016-01-22 ENCOUNTER — Telehealth: Payer: Self-pay

## 2016-01-22 ENCOUNTER — Encounter: Payer: Self-pay | Admitting: Urology

## 2016-01-22 NOTE — Telephone Encounter (Signed)
Pt called again today stating he is still having pain post vasectomy. Pt stated that he has not been using ice and since calling yesterday has started alternating motrin and tylenol. Reinforced with pt to use ice packs in 31min intervals and take tylenol and motrin. Reinforced with pt it may take a couple of days for the pain to subside due to the vasectomy. Pt voiced understanding.

## 2016-01-22 NOTE — Telephone Encounter (Signed)
I called the pt back to f/u with him concerning his right-side testicular pain after Vasectomy x 6 days ago. Pt was given the option to come in to be seen per Dr. Erlene Quan. He declined the appt, because he stated his symptoms are improving with alternating the IBU,tylenol and ice.

## 2016-01-31 ENCOUNTER — Telehealth: Payer: Self-pay

## 2016-01-31 NOTE — Telephone Encounter (Signed)
PLEASE NOTE: All timestamps contained within this report are represented as [place] Standard Time. CONFIDENTIALTY NOTICE: This fax transmission is intended only for the addressee. It contains information that is legally privileged, confidential or otherwise protected from use or disclosure. If you are not the intended recipient, you are strictly prohibited from reviewing, disclosing, copying using or disseminating any of this information or taking any action in reliance on or regarding this information. If you have received this fax in error, please notify us immediately by telephone so that we can arrange for its return to [place]. Phone: [phone], Toll-Free: [phone], Fax: [phone] Page: 1 of 1 Call Id: [id number] [place] [facility] - Client Nonclinical Telephone Record [place] [facility] - Client Client Site [place] Physician Ria Bush - MD Contact Type Call Who Is Calling Patient / Member / Family / Caregiver Caller Name Dreshaun Shriber Caller Phone Number [phone] Patient Name [name] Call Type Message Only Information Provided Reason for Call Request to Schedule Office Appointment Initial Comment Caller needs to make an appt Call Closed By: Sofie Rower Transaction Date/Time: 01/31/2016 3:40:55 PM (ET)

## 2016-01-31 NOTE — Telephone Encounter (Signed)
Spoke with pt and he has already made appt with Dr Darnell Level on 02/03/16 at 10:15.

## 2016-02-03 ENCOUNTER — Encounter: Payer: Self-pay | Admitting: Family Medicine

## 2016-02-03 ENCOUNTER — Ambulatory Visit (INDEPENDENT_AMBULATORY_CARE_PROVIDER_SITE_OTHER): Payer: BLUE CROSS/BLUE SHIELD | Admitting: Family Medicine

## 2016-02-03 DIAGNOSIS — K625 Hemorrhage of anus and rectum: Secondary | ICD-10-CM

## 2016-02-03 NOTE — Progress Notes (Signed)
BP 136/80   Pulse 84   Temp (P) 98.2 F (36.8 C) (Oral)   Wt 210 lb 4 oz (95.4 kg)   BMI 31.97 kg/m    CC: BRBPR last week Subjective:    Patient ID: Tim Hawkins, male    DOB: 10/13/1970, 45 y.o.   MRN: [id number]  HPI: Tim Hawkins is a 45 y.o. male presenting on 02/03/2016 for Blood In Stools (last week; BRB)   Last week found BRB when wiping on TP, none in stool or in commode. No clots. Over next few days slowly resolved. Denies pain with evacuation. He did have some abdominal cramping and looser stools on the day with most noted BRB. No h/o hemorrhoids. No further BM changes.  Normally regular - 1 soft BM/day. No straining.  Does not linger on commode.  S/p vasectomy a few weeks ago.   Both parents with acid reflux. No IBD in family. No colon cancer in family.  Half sister diagnosed with celiac disease (35yo).   Relevant past medical, surgical, family and social history reviewed and updated as indicated. Interim medical history since our last visit reviewed. Allergies and medications reviewed and updated. Current Outpatient Prescriptions on File Prior to Visit  Medication Sig  . CINNAMON PO Take 1 capsule by mouth daily.  Marland Kitchen glucose blood test strip 1 each by Other route daily. Use to check sugar once daily Dx: E11.9 **One Touch Ultra Mini Blue**  . metFORMIN (GLUCOPHAGE) 500 MG tablet TAKE 1 TABLET TWICE A DAY WITH MEALS  . Multiple Vitamin (ONE-A-DAY MENS PO) Take 1 tablet by mouth daily.  . Omega-3 Fatty Acids (FISH OIL PO) Take 1 capsule by mouth daily.  . diazepam (VALIUM) 10 MG tablet Take 30 minutes prior to vasectomy (Patient not taking: Reported on 02/03/2016)   No current facility-administered medications on file prior to visit.     Review of Systems Per HPI unless specifically indicated in ROS section     Objective:    BP 136/80   Pulse 84   Temp (P) 98.2 F (36.8 C) (Oral)   Wt 210 lb 4 oz (95.4 kg)   BMI 31.97 kg/m   Wt Readings from Last 3  Encounters:  02/03/16 210 lb 4 oz (95.4 kg)  01/16/16 208 lb (94.3 kg)  11/20/15 213 lb (96.6 kg)    Physical Exam  Constitutional: He appears well-developed and well-nourished. No distress.  HENT:  Mouth/Throat: Oropharynx is clear and moist. No oropharyngeal exudate.  Cardiovascular: Normal rate, regular rhythm, normal heart sounds and intact distal pulses.   No murmur heard. Pulmonary/Chest: Effort normal and breath sounds normal. No respiratory distress. He has no wheezes. He has no rales.  Abdominal: Soft. Normal appearance and bowel sounds are normal. He exhibits no distension and no mass. There is no hepatosplenomegaly. There is no tenderness. There is no rebound, no guarding and no CVA tenderness.  Genitourinary: Rectal exam shows no external hemorrhoid, no fissure and no tenderness.  Genitourinary Comments: No obvious ext hemorrhoids or anal fissure on exam. DRE not performed.  Nursing note and vitals reviewed.  Results for orders placed or performed in visit on 11/20/15  HM DIABETES EYE EXAM  Result Value Ref Range   HM Diabetic Eye Exam No Retinopathy No Retinopathy      Assessment & Plan:   Problem List Items Addressed This Visit    BRBPR (bright red blood per rectum)    Anticipate due to int hemorrhoid. Discussed goal 1 soft  stool/day. Discussed increased water intake, increased fiber or fiber supplement. Update if ongoing bleeding or BM changes for further eval to include DRE and possible GI referral.        Other Visit Diagnoses   None.      Follow up plan: Return if symptoms worsen or fail to improve.  Ria Bush, MD

## 2016-02-03 NOTE — Patient Instructions (Addendum)
Possible internal hemorrhoid, exam ok today.  Goal 1 soft stool a day. Make sure to drink plenty of water daily, try to increase fiber slowly in diet as well. Fiber handout provided. If not getting good fiber in diet, consider adding fiber supplement (pill or powder like metamucil).  If ongoing trouble with bowel changes or blood, let me know.

## 2016-02-03 NOTE — Progress Notes (Signed)
Pre visit review using our clinic review tool, if applicable. No additional management support is needed unless otherwise documented below in the visit note. 

## 2016-02-03 NOTE — Assessment & Plan Note (Signed)
Anticipate due to int hemorrhoid. Discussed goal 1 soft stool/day. Discussed increased water intake, increased fiber or fiber supplement. Update if ongoing bleeding or BM changes for further eval to include DRE and possible GI referral.

## 2016-03-22 ENCOUNTER — Other Ambulatory Visit: Payer: Self-pay | Admitting: Family Medicine

## 2016-04-15 ENCOUNTER — Encounter: Payer: Self-pay | Admitting: Family Medicine

## 2016-04-15 ENCOUNTER — Ambulatory Visit (INDEPENDENT_AMBULATORY_CARE_PROVIDER_SITE_OTHER): Payer: BLUE CROSS/BLUE SHIELD | Admitting: Family Medicine

## 2016-04-15 VITALS — BP 142/80 | HR 84 | Temp 98.2°F | Wt 210.5 lb

## 2016-04-15 DIAGNOSIS — Z23 Encounter for immunization: Secondary | ICD-10-CM

## 2016-04-15 DIAGNOSIS — E119 Type 2 diabetes mellitus without complications: Secondary | ICD-10-CM | POA: Diagnosis not present

## 2016-04-15 DIAGNOSIS — E669 Obesity, unspecified: Secondary | ICD-10-CM

## 2016-04-15 DIAGNOSIS — B353 Tinea pedis: Secondary | ICD-10-CM | POA: Diagnosis not present

## 2016-04-15 LAB — BASIC METABOLIC PANEL
BUN: 16 mg/dL (ref 6–23)
CALCIUM: 9.6 mg/dL (ref 8.4–10.5)
CHLORIDE: 102 meq/L (ref 96–112)
CO2: 29 mEq/L (ref 19–32)
Creatinine, Ser: 0.5 mg/dL (ref 0.40–1.50)
GFR: 189.31 mL/min (ref >60.00)
GLUCOSE: 143 mg/dL — AB (ref 70–99)
POTASSIUM: 4.3 meq/L (ref 3.5–5.1)
SODIUM: 138 meq/L (ref 135–145)

## 2016-04-15 LAB — HEMOGLOBIN A1C: Hgb A1c MFr Bld: 6.5 % (ref 4.6–6.5)

## 2016-04-15 NOTE — Progress Notes (Signed)
BP (!) 142/80   Pulse 84   Temp 98.2 F (36.8 C) (Oral)   Wt 210 lb 8 oz (95.5 kg)   BMI 32.01 kg/m    CC: 9mo f/u visit Subjective:    Patient ID: Tim Hawkins, male    DOB: 11-13-1970, 45 y.o.   MRN: [id number]  HPI: Tim Hawkins is a 45 y.o. male presenting on 04/15/2016 for Follow-up   Just got married!   DM - regularly does check sugars 130 fasting. Compliant with antihyperglycemic regimen which includes: metformin 500mg  bid. Denies low sugars or hypoglycemic symptoms.  Denies paresthesias. Last diabetic eye exam 08/2015.  Pneumovax: 10/2014.  Prevnar: not due yet. Lab Results  Component Value Date   HGBA1C 7.1 (H) 11/13/2015   Diabetic Foot Exam - Simple   Simple Foot Form Diabetic Foot exam was performed with the following findings:  Yes 04/15/2016  8:39 AM  Visual Inspection No deformities, no ulcerations, no other skin breakdown bilaterally:  Yes Sensation Testing Intact to touch and monofilament testing bilaterally:  Yes Pulse Check Posterior Tibialis and Dorsalis pulse intact bilaterally:  Yes Comments onychomycotic nails, scaling of bilateral soles      Relevant past medical, surgical, family and social history reviewed and updated as indicated. Interim medical history since our last visit reviewed. Allergies and medications reviewed and updated. Current Outpatient Prescriptions on File Prior to Visit  Medication Sig  . CINNAMON PO Take 1 capsule by mouth daily.  Marland Kitchen glucose blood test strip 1 each by Other route daily. Use to check sugar once daily Dx: E11.9 **One Touch Ultra Mini Blue**  . metFORMIN (GLUCOPHAGE) 500 MG tablet TAKE 1 TABLET TWICE A DAY WITH MEALS  . Multiple Vitamin (ONE-A-DAY MENS PO) Take 1 tablet by mouth daily.  . Omega-3 Fatty Acids (FISH OIL PO) Take 1 capsule by mouth daily.   No current facility-administered medications on file prior to visit.     Review of Systems Per HPI unless specifically indicated in ROS section    Objective:    BP (!) 142/80   Pulse 84   Temp 98.2 F (36.8 C) (Oral)   Wt 210 lb 8 oz (95.5 kg)   BMI 32.01 kg/m   Wt Readings from Last 3 Encounters:  04/15/16 210 lb 8 oz (95.5 kg)  02/03/16 210 lb 4 oz (95.4 kg)  01/16/16 208 lb (94.3 kg)    Physical Exam  Constitutional: He appears well-developed and well-nourished. No distress.  HENT:  Head: Normocephalic and atraumatic.  Right Ear: External ear normal.  Left Ear: External ear normal.  Nose: Nose normal.  Mouth/Throat: Oropharynx is clear and moist. No oropharyngeal exudate.  Eyes: Conjunctivae and EOM are normal. Pupils are equal, round, and reactive to light. No scleral icterus.  Neck: Normal range of motion. Neck supple.  Cardiovascular: Normal rate, regular rhythm, normal heart sounds and intact distal pulses.   No murmur heard. Pulmonary/Chest: Effort normal and breath sounds normal. No respiratory distress. He has no wheezes. He has no rales.  Musculoskeletal: He exhibits no edema.  See HPI for foot exam if done  Lymphadenopathy:    He has no cervical adenopathy.  Skin: Skin is warm and dry. No rash noted.  Psychiatric: He has a normal mood and affect.  Nursing note and vitals reviewed.  Results for orders placed or performed in visit on 11/20/15  HM DIABETES EYE EXAM  Result Value Ref Range   HM Diabetic Eye Exam No Retinopathy No Retinopathy  Assessment & Plan:   Problem List Items Addressed This Visit    Diabetes type 2, controlled ([place]) - Primary    Chronic, anticipate mildly deteriorated. Check labs today. Continue metformin. Pt motivated to make healthy changes.      Relevant Orders   Basic metabolic panel   Hemoglobin A1c   Obesity, Class I, BMI 30-34.9    Pt motivated to return to healthy diet and lifestyle choices (preparing meals, increased walking) in effort to lose weight.       Tinea pedis    Encouraged regular moisturizing of feet as well as OTC lotrimin use PRN.       Other  Visit Diagnoses    Need for influenza vaccination       Relevant Orders   Flu Vaccine QUAD 36+ mos PF IM (Fluarix & Fluzone Quad PF)       Follow up plan: Return in about 7 months (around 11/13/2016) for annual exam, prior fasting for blood work.  Ria Bush, MD

## 2016-04-15 NOTE — Progress Notes (Signed)
Pre visit review using our clinic review tool, if applicable. No additional management support is needed unless otherwise documented below in the visit note. 

## 2016-04-15 NOTE — Patient Instructions (Addendum)
Flu shot today. Lab work today.  Return to regular moisturizing cream on feet - add lotrimin as needed (antifungal).  Return after 11/19/2016 for physical.

## 2016-04-15 NOTE — Assessment & Plan Note (Signed)
Pt motivated to return to healthy diet and lifestyle choices (preparing meals, increased walking) in effort to lose weight.

## 2016-04-15 NOTE — Assessment & Plan Note (Signed)
Encouraged regular moisturizing of feet as well as OTC lotrimin use PRN.

## 2016-04-15 NOTE — Assessment & Plan Note (Signed)
Chronic, anticipate mildly deteriorated. Check labs today. Continue metformin. Pt motivated to make healthy changes.

## 2016-04-17 ENCOUNTER — Other Ambulatory Visit: Payer: BLUE CROSS/BLUE SHIELD

## 2016-04-17 DIAGNOSIS — Z9852 Vasectomy status: Secondary | ICD-10-CM

## 2016-04-18 LAB — POST-VAS SPERM EVALUATION,QUAL
Post-Vas Sperm, Qual: ABSENT
Volume: 3.2 mL

## 2016-04-20 ENCOUNTER — Ambulatory Visit: Payer: BLUE CROSS/BLUE SHIELD | Admitting: Family Medicine

## 2016-04-20 ENCOUNTER — Telehealth: Payer: Self-pay

## 2016-04-20 NOTE — Telephone Encounter (Signed)
-----   Message from Hollice Espy, MD sent at 04/20/2016  7:49 AM EDT ----- No sperm in his specimen.  Good to go!  Hollice Espy, MD

## 2016-04-20 NOTE — Telephone Encounter (Signed)
Spoke with pt in reference to semen analysis. Pt voiced understanding.

## 2016-06-16 ENCOUNTER — Other Ambulatory Visit: Payer: Self-pay | Admitting: Family Medicine

## 2016-10-05 ENCOUNTER — Other Ambulatory Visit: Payer: Self-pay | Admitting: Family Medicine

## 2016-11-14 ENCOUNTER — Other Ambulatory Visit: Payer: Self-pay | Admitting: Family Medicine

## 2016-11-14 DIAGNOSIS — K760 Fatty (change of) liver, not elsewhere classified: Secondary | ICD-10-CM

## 2016-11-14 DIAGNOSIS — E785 Hyperlipidemia, unspecified: Secondary | ICD-10-CM

## 2016-11-14 DIAGNOSIS — E119 Type 2 diabetes mellitus without complications: Secondary | ICD-10-CM

## 2016-11-19 ENCOUNTER — Other Ambulatory Visit: Payer: BLUE CROSS/BLUE SHIELD

## 2016-11-24 ENCOUNTER — Encounter: Payer: BLUE CROSS/BLUE SHIELD | Admitting: Family Medicine

## 2016-12-08 LAB — HM DIABETES EYE EXAM

## 2016-12-15 ENCOUNTER — Other Ambulatory Visit (INDEPENDENT_AMBULATORY_CARE_PROVIDER_SITE_OTHER): Payer: BLUE CROSS/BLUE SHIELD

## 2016-12-15 ENCOUNTER — Encounter (INDEPENDENT_AMBULATORY_CARE_PROVIDER_SITE_OTHER): Payer: Self-pay

## 2016-12-15 DIAGNOSIS — E119 Type 2 diabetes mellitus without complications: Secondary | ICD-10-CM

## 2016-12-15 DIAGNOSIS — K760 Fatty (change of) liver, not elsewhere classified: Secondary | ICD-10-CM

## 2016-12-15 DIAGNOSIS — E785 Hyperlipidemia, unspecified: Secondary | ICD-10-CM

## 2016-12-15 LAB — COMPREHENSIVE METABOLIC PANEL
ALBUMIN: 4.2 g/dL (ref 3.5–5.2)
ALK PHOS: 81 U/L (ref 39–117)
ALT: 35 U/L (ref 0–53)
AST: 18 U/L (ref 0–37)
BUN: 14 mg/dL (ref 6–23)
CO2: 28 mEq/L (ref 19–32)
CREATININE: 0.47 mg/dL (ref 0.40–1.50)
Calcium: 9.5 mg/dL (ref 8.4–10.5)
Chloride: 103 mEq/L (ref 96–112)
GFR: 202.74 mL/min (ref >60.00)
Glucose, Bld: 135 mg/dL — ABNORMAL HIGH (ref 70–99)
Potassium: 4.5 mEq/L (ref 3.5–5.1)
SODIUM: 138 meq/L (ref 135–145)
TOTAL PROTEIN: 6.9 g/dL (ref 6.0–8.3)
Total Bilirubin: 0.4 mg/dL (ref 0.2–1.2)

## 2016-12-15 LAB — LIPID PANEL
Cholesterol: 150 mg/dL (ref 0–200)
HDL: 27.9 mg/dL — AB (ref >39.00)
NonHDL: 122.56
Total CHOL/HDL Ratio: 5
Triglycerides: 236 mg/dL — ABNORMAL HIGH (ref 0.0–149.0)
VLDL: 47.2 mg/dL — ABNORMAL HIGH (ref 0.0–40.0)

## 2016-12-15 LAB — LDL CHOLESTEROL, DIRECT: Direct LDL: 90 mg/dL

## 2016-12-15 LAB — HEMOGLOBIN A1C: HEMOGLOBIN A1C: 6.7 % — AB (ref 4.6–6.5)

## 2016-12-15 LAB — MICROALBUMIN / CREATININE URINE RATIO
Creatinine,U: 42.8 mg/dL
MICROALB/CREAT RATIO: 1.6 mg/g (ref 0.0–30.0)
Microalb, Ur: <0.7 mg/dL (ref 0.0–1.9)

## 2016-12-22 ENCOUNTER — Encounter: Payer: Self-pay | Admitting: Family Medicine

## 2016-12-22 ENCOUNTER — Ambulatory Visit (INDEPENDENT_AMBULATORY_CARE_PROVIDER_SITE_OTHER): Payer: BLUE CROSS/BLUE SHIELD | Admitting: Family Medicine

## 2016-12-22 VITALS — BP 138/78 | HR 76 | Temp 98.4°F | Ht 68.0 in | Wt 208.5 lb

## 2016-12-22 DIAGNOSIS — E785 Hyperlipidemia, unspecified: Secondary | ICD-10-CM | POA: Diagnosis not present

## 2016-12-22 DIAGNOSIS — E669 Obesity, unspecified: Secondary | ICD-10-CM | POA: Diagnosis not present

## 2016-12-22 DIAGNOSIS — E119 Type 2 diabetes mellitus without complications: Secondary | ICD-10-CM

## 2016-12-22 DIAGNOSIS — Z0000 Encounter for general adult medical examination without abnormal findings: Secondary | ICD-10-CM

## 2016-12-22 NOTE — Assessment & Plan Note (Signed)
Chronic. Continues fish oil daily. Discussed ASCVD risk. He does have fmhx premature CAD. Desires 6 mo trial TLC and if score persistently elevated next time, he agrees to start statin.  The 10-year ASCVD risk score Mikey Bussing DC Brooke Bonito., et al., 2013) is: 7%   Values used to calculate the score:     Age: 46 years     Sex: Male     Is Non-Hispanic African American: No     Diabetic: Yes     Tobacco smoker: No     Systolic Blood Pressure: 165 mmHg     Is BP treated: No     HDL Cholesterol: 27.9 mg/dL     Total Cholesterol: 150 mg/dL

## 2016-12-22 NOTE — Progress Notes (Signed)
BP 138/78 (BP Location: Right Arm, Cuff Size: Large)   Pulse 76   Temp 98.4 F (36.9 C)   Ht 5\' 8"  (1.727 m)   Wt 208 lb 8 oz (94.6 kg)   SpO2 97%   BMI 31.70 kg/m    CC: CPE Subjective:    Patient ID: Tim Hawkins, male    DOB: 12/30/1970, 46 y.o.   MRN: [id number]  HPI: Tim Hawkins is a 46 y.o. male presenting on 12/22/2016 for Annual Exam   Uses triamcinolone cream for hand rash.   Preventative: Flu shot yearly Pneumovax 2016 Tdap 2016 Seat belt use discussed Sunscreen use discussed, no changing moles on skin.  Ex smoker - quit 2014 Alcohol - rare  Lives with wife [name]. No pets  Occupation: truck Geophysicist/field seismologist  Activity: tries to walk regularly 30 min  Diet: good water, tries daily fruits/vegetables   Relevant past medical, surgical, family and social history reviewed and updated as indicated. Interim medical history since our last visit reviewed. Allergies and medications reviewed and updated. Outpatient Medications Prior to Visit  Medication Sig Dispense Refill  . CINNAMON PO Take 1 capsule by mouth daily.    Marland Kitchen glucose blood test strip 1 each by Other route daily. Use to check sugar once daily Dx: E11.9 **One Touch Ultra Mini Blue** 100 each 3  . metFORMIN (GLUCOPHAGE) 500 MG tablet TAKE 1 TABLET TWICE A DAY WITH MEALS 180 tablet 1  . Multiple Vitamin (ONE-A-DAY MENS PO) Take 1 tablet by mouth daily.    . Omega-3 Fatty Acids (FISH OIL PO) Take 1 capsule by mouth daily.    . Wheat Dextrin (BENEFIBER) POWD Take by mouth 2 (two) times daily.     No facility-administered medications prior to visit.      Per HPI unless specifically indicated in ROS section below Review of Systems  Constitutional: Negative for activity change, appetite change, chills, fatigue, fever and unexpected weight change.  HENT: Negative for hearing loss.   Eyes: Negative for visual disturbance.  Respiratory: Negative for cough, chest tightness, shortness of breath and wheezing.     Cardiovascular: Negative for chest pain, palpitations and leg swelling.  Gastrointestinal: Negative for abdominal distention, abdominal pain, blood in stool, constipation, diarrhea, nausea and vomiting.  Genitourinary: Negative for difficulty urinating and hematuria.  Musculoskeletal: Negative for arthralgias, myalgias and neck pain.  Skin: Negative for rash.  Neurological: Negative for dizziness, seizures, syncope and headaches.  Hematological: Negative for adenopathy. Does not bruise/bleed easily.  Psychiatric/Behavioral: Negative for dysphoric mood. The patient is not nervous/anxious.        Objective:    BP 138/78 (BP Location: Right Arm, Cuff Size: Large)   Pulse 76   Temp 98.4 F (36.9 C)   Ht 5\' 8"  (1.727 m)   Wt 208 lb 8 oz (94.6 kg)   SpO2 97%   BMI 31.70 kg/m   Wt Readings from Last 3 Encounters:  12/22/16 208 lb 8 oz (94.6 kg)  04/15/16 210 lb 8 oz (95.5 kg)  02/03/16 210 lb 4 oz (95.4 kg)    Physical Exam  Constitutional: He is oriented to person, place, and time. He appears well-developed and well-nourished. No distress.  HENT:  Head: Normocephalic and atraumatic.  Right Ear: Hearing, tympanic membrane, external ear and ear canal normal.  Left Ear: Hearing, tympanic membrane, external ear and ear canal normal.  Nose: Nose normal.  Mouth/Throat: Uvula is midline, oropharynx is clear and moist and mucous membranes are normal. No  oropharyngeal exudate, posterior oropharyngeal edema or posterior oropharyngeal erythema.  Eyes: Conjunctivae and EOM are normal. Pupils are equal, round, and reactive to light. No scleral icterus.  Neck: Normal range of motion. Neck supple. No thyromegaly present.  Cardiovascular: Normal rate, regular rhythm, normal heart sounds and intact distal pulses.   No murmur heard. Pulses:      Radial pulses are 2+ on the right side, and 2+ on the left side.  Pulmonary/Chest: Effort normal and breath sounds normal. No respiratory distress. He has  no wheezes. He has no rales.  Abdominal: Soft. Bowel sounds are normal. He exhibits no distension and no mass. There is no tenderness. There is no rebound and no guarding.  Musculoskeletal: Normal range of motion. He exhibits no edema.  Lymphadenopathy:    He has no cervical adenopathy.  Neurological: He is alert and oriented to person, place, and time.  CN grossly intact, station and gait intact  Skin: Skin is warm and dry. No rash noted.  Psychiatric: He has a normal mood and affect. His behavior is normal. Judgment and thought content normal.  Nursing note and vitals reviewed.  Results for orders placed or performed in visit on 12/22/16  HM DIABETES EYE EXAM  Result Value Ref Range   HM Diabetic Eye Exam No Retinopathy No Retinopathy      Assessment & Plan:   Problem List Items Addressed This Visit    Diabetes type 2, controlled ([place])    Chronic, stable. Continue current regimen of cinnamon and metformin 500mg  bid. RTC 6 mo DM f/u visit.       Dyslipidemia    Chronic. Continues fish oil daily. Discussed ASCVD risk. He does have fmhx premature CAD. Desires 6 mo trial TLC and if score persistently elevated next time, he agrees to start statin.  The 10-year ASCVD risk score Mikey Bussing DC Brooke Bonito., et al., 2013) is: 7%   Values used to calculate the score:     Age: 2 years     Sex: Male     Is Non-Hispanic African American: No     Diabetic: Yes     Tobacco smoker: No     Systolic Blood Pressure: 956 mmHg     Is BP treated: No     HDL Cholesterol: 27.9 mg/dL     Total Cholesterol: 150 mg/dL       Health maintenance examination - Primary    Preventative protocols reviewed and updated unless pt declined. Discussed healthy diet and lifestyle.       Obesity, Class I, BMI 30-34.9    Discussed healthy diet and lifestyle changes to affect sustainable weight loss.           Follow up plan: Return in about 6 months (around 06/23/2017) for follow up visit.  Ria Bush, MD

## 2016-12-22 NOTE — Assessment & Plan Note (Signed)
Preventative protocols reviewed and updated unless pt declined. Discussed healthy diet and lifestyle.  

## 2016-12-22 NOTE — Assessment & Plan Note (Signed)
Discussed healthy diet and lifestyle changes to affect sustainable weight loss  

## 2016-12-22 NOTE — Assessment & Plan Note (Signed)
Chronic, stable. Continue current regimen of cinnamon and metformin 500mg  bid. RTC 6 mo DM f/u visit.

## 2016-12-22 NOTE — Patient Instructions (Addendum)
You are doing well today. Work on walking routine.  Return in 6 months for diabetes and cholesterol follow up  Health Maintenance, Male A healthy lifestyle and preventive care is important for your health and wellness. Ask your health care provider about what schedule of regular examinations is right for you. What should I know about weight and diet? Eat a Healthy Diet  Eat plenty of vegetables, fruits, whole grains, low-fat dairy products, and lean protein.  Do not eat a lot of foods high in solid fats, added sugars, or salt.  Maintain a Healthy Weight Regular exercise can help you achieve or maintain a healthy weight. You should:  Do at least 150 minutes of exercise each week. The exercise should increase your heart rate and make you sweat (moderate-intensity exercise).  Do strength-training exercises at least twice a week.  Watch Your Levels of Cholesterol and Blood Lipids  Have your blood tested for lipids and cholesterol every 5 years starting at 48 years of age. If you are at high risk for heart disease, you should start having your blood tested when you are 48 years old. You may need to have your cholesterol levels checked more often if: ? Your lipid or cholesterol levels are high. ? You are older than 48 years of age. ? You are at high risk for heart disease.  What should I know about cancer screening? Many types of cancers can be detected early and may often be prevented. Lung Cancer  You should be screened every year for lung cancer if: ? You are a current smoker who has smoked for at least 30 years. ? You are a former smoker who has quit within the past 15 years.  Talk to your health care provider about your screening options, when you should start screening, and how often you should be screened.  Colorectal Cancer  Routine colorectal cancer screening usually begins at 48 years of age and should be repeated every 5-10 years until you are 48 years old. You may need to  be screened more often if early forms of precancerous polyps or small growths are found. Your health care provider may recommend screening at an earlier age if you have risk factors for colon cancer.  Your health care provider may recommend using home test kits to check for hidden blood in the stool.  A small camera at the end of a tube can be used to examine your colon (sigmoidoscopy or colonoscopy). This checks for the earliest forms of colorectal cancer.  Prostate and Testicular Cancer  Depending on your age and overall health, your health care provider may do certain tests to screen for prostate and testicular cancer.  Talk to your health care provider about any symptoms or concerns you have about testicular or prostate cancer.  Skin Cancer  Check your skin from head to toe regularly.  Tell your health care provider about any new moles or changes in moles, especially if: ? There is a change in a mole's size, shape, or color. ? You have a mole that is larger than a pencil eraser.  Always use sunscreen. Apply sunscreen liberally and repeat throughout the day.  Protect yourself by wearing long sleeves, pants, a wide-brimmed hat, and sunglasses when outside.  What should I know about heart disease, diabetes, and high blood pressure?  If you are 66-41 years of age, have your blood pressure checked every 3-5 years. If you are 8 years of age or older, have your blood pressure checked  every year. You should have your blood pressure measured twice-once when you are at a hospital or clinic, and once when you are not at a hospital or clinic. Record the average of the two measurements. To check your blood pressure when you are not at a hospital or clinic, you can use: ? An automated blood pressure machine at a pharmacy. ? A home blood pressure monitor.  Talk to your health care provider about your target blood pressure.  If you are between 93-77 years old, ask your health care provider if  you should take aspirin to prevent heart disease.  Have regular diabetes screenings by checking your fasting blood sugar level. ? If you are at a normal weight and have a low risk for diabetes, have this test once every three years after the age of 43. ? If you are overweight and have a high risk for diabetes, consider being tested at a younger age or more often.  A one-time screening for abdominal aortic aneurysm (AAA) by ultrasound is recommended for men aged 40-75 years who are current or former smokers. What should I know about preventing infection? Hepatitis B If you have a higher risk for hepatitis B, you should be screened for this virus. Talk with your health care provider to find out if you are at risk for hepatitis B infection. Hepatitis C Blood testing is recommended for:  Everyone born from [birth year] through 1967.  Anyone with known risk factors for hepatitis C.  Sexually Transmitted Diseases (STDs)  You should be screened each year for STDs including gonorrhea and chlamydia if: ? You are sexually active and are younger than 48 years of age. ? You are older than 48 years of age and your health care provider tells you that you are at risk for this type of infection. ? Your sexual activity has changed since you were last screened and you are at an increased risk for chlamydia or gonorrhea. Ask your health care provider if you are at risk.  Talk with your health care provider about whether you are at high risk of being infected with HIV. Your health care provider may recommend a prescription medicine to help prevent HIV infection.  What else can I do?  Schedule regular health, dental, and eye exams.  Stay current with your vaccines (immunizations).  Do not use any tobacco products, such as cigarettes, chewing tobacco, and e-cigarettes. If you need help quitting, ask your health care provider.  Limit alcohol intake to no more than 2 drinks per day. One drink equals 12 ounces of  beer, 5 ounces of wine, or 1 ounces of hard liquor.  Do not use street drugs.  Do not share needles.  Ask your health care provider for help if you need support or information about quitting drugs.  Tell your health care provider if you often feel depressed.  Tell your health care provider if you have ever been abused or do not feel safe at home. This information is not intended to replace advice given to you by your health care provider. Make sure you discuss any questions you have with your health care provider. Document Released: 12/12/2007 Document Revised: 02/12/2016 Document Reviewed: 03/19/2015 Elsevier Interactive Patient Education  [employer].

## 2017-01-11 DIAGNOSIS — F341 Dysthymic disorder: Secondary | ICD-10-CM | POA: Diagnosis not present

## 2017-01-25 DIAGNOSIS — F341 Dysthymic disorder: Secondary | ICD-10-CM | POA: Diagnosis not present

## 2017-03-12 DIAGNOSIS — F341 Dysthymic disorder: Secondary | ICD-10-CM | POA: Diagnosis not present

## 2017-03-30 ENCOUNTER — Other Ambulatory Visit: Payer: Self-pay | Admitting: Family Medicine

## 2017-04-16 DIAGNOSIS — F341 Dysthymic disorder: Secondary | ICD-10-CM | POA: Diagnosis not present

## 2017-04-30 DIAGNOSIS — F341 Dysthymic disorder: Secondary | ICD-10-CM | POA: Diagnosis not present

## 2017-05-13 ENCOUNTER — Ambulatory Visit (INDEPENDENT_AMBULATORY_CARE_PROVIDER_SITE_OTHER): Payer: BLUE CROSS/BLUE SHIELD

## 2017-05-13 DIAGNOSIS — Z23 Encounter for immunization: Secondary | ICD-10-CM

## 2017-05-14 DIAGNOSIS — F341 Dysthymic disorder: Secondary | ICD-10-CM | POA: Diagnosis not present

## 2017-06-10 ENCOUNTER — Encounter: Payer: Self-pay | Admitting: Family Medicine

## 2017-06-10 ENCOUNTER — Ambulatory Visit: Payer: BLUE CROSS/BLUE SHIELD | Admitting: Family Medicine

## 2017-06-10 VITALS — BP 146/72 | HR 81 | Temp 98.7°F | Wt 204.8 lb

## 2017-06-10 DIAGNOSIS — J029 Acute pharyngitis, unspecified: Secondary | ICD-10-CM

## 2017-06-10 LAB — POCT RAPID STREP A (OFFICE): Rapid Strep A Screen: POSITIVE — AB

## 2017-06-10 MED ORDER — AMOXICILLIN 875 MG PO TABS: 875.0000 mg | ORAL_TABLET | Freq: Two times a day (BID) | ORAL | 0 refills | Status: DC

## 2017-06-10 NOTE — Patient Instructions (Signed)
Start amoxil.  Rest and fluids.  Update [place] as needed.  Take care.  Glad to see you.

## 2017-06-10 NOTE — Progress Notes (Signed)
Sx started about 1 day ago.  First noted ST then white patches seen in the throat.  No fevers.  No vomiting.  Mild cough.  Some occ sputum.  No ear pain.  Mild rhinorrhea.  Wife was sick and going to the doctor today with similar sx.  He doesn't feel awful but mildly fatigued.  Working at [employer], off today and tomorrow.  No rash.   Meds, vitals, and allergies reviewed.   ROS: Per HPI unless specifically indicated in ROS section   GEN: nad, alert and oriented HEENT: mucous membranes moist, tm w/o erythema, nasal exam w/o erythema, clear discharge noted,  OP with cobblestoning and exudates noted NECK: supple w/o LA CV: rrr.   PULM: ctab, no inc wob EXT: no edema SKIN: no acute rash  Strep test faintly positive.  D/w pt at OV.

## 2017-06-11 DIAGNOSIS — J029 Acute pharyngitis, unspecified: Secondary | ICD-10-CM | POA: Insufficient documentation

## 2017-06-11 NOTE — Assessment & Plan Note (Signed)
Strep test faintly positive.  D/w pt at OV.   Nontoxic.  Amoxil.  Supportive care.  Update as as needed.  He agrees.

## 2017-06-17 ENCOUNTER — Other Ambulatory Visit: Payer: Self-pay | Admitting: Family Medicine

## 2017-06-17 DIAGNOSIS — E119 Type 2 diabetes mellitus without complications: Secondary | ICD-10-CM

## 2017-06-17 DIAGNOSIS — E785 Hyperlipidemia, unspecified: Secondary | ICD-10-CM

## 2017-06-18 ENCOUNTER — Other Ambulatory Visit (INDEPENDENT_AMBULATORY_CARE_PROVIDER_SITE_OTHER): Payer: BLUE CROSS/BLUE SHIELD

## 2017-06-18 DIAGNOSIS — E785 Hyperlipidemia, unspecified: Secondary | ICD-10-CM | POA: Diagnosis not present

## 2017-06-18 DIAGNOSIS — E119 Type 2 diabetes mellitus without complications: Secondary | ICD-10-CM | POA: Diagnosis not present

## 2017-06-18 LAB — LIPID PANEL
CHOLESTEROL: 142 mg/dL (ref 0–200)
HDL: 31.3 mg/dL — ABNORMAL LOW (ref >39.00)
LDL Cholesterol: 83 mg/dL (ref 0–99)
NONHDL: 111.06
Total CHOL/HDL Ratio: 5
Triglycerides: 142 mg/dL (ref 0.0–149.0)
VLDL: 28.4 mg/dL (ref 0.0–40.0)

## 2017-06-18 LAB — HEMOGLOBIN A1C: HEMOGLOBIN A1C: 6.9 % — AB (ref 4.6–6.5)

## 2017-06-24 ENCOUNTER — Ambulatory Visit: Payer: BLUE CROSS/BLUE SHIELD | Admitting: Family Medicine

## 2017-06-25 ENCOUNTER — Encounter: Payer: Self-pay | Admitting: Family Medicine

## 2017-06-25 ENCOUNTER — Ambulatory Visit: Payer: BLUE CROSS/BLUE SHIELD | Admitting: Family Medicine

## 2017-06-25 VITALS — BP 136/78 | HR 81 | Temp 98.0°F | Wt 203.0 lb

## 2017-06-25 DIAGNOSIS — E119 Type 2 diabetes mellitus without complications: Secondary | ICD-10-CM | POA: Diagnosis not present

## 2017-06-25 DIAGNOSIS — E785 Hyperlipidemia, unspecified: Secondary | ICD-10-CM | POA: Diagnosis not present

## 2017-06-25 DIAGNOSIS — E669 Obesity, unspecified: Secondary | ICD-10-CM | POA: Diagnosis not present

## 2017-06-25 MED ORDER — METFORMIN HCL 500 MG PO TABS: 500.0000 mg | ORAL_TABLET | Freq: Two times a day (BID) | ORAL | 3 refills | Status: DC

## 2017-06-25 NOTE — Progress Notes (Signed)
BP 136/78 (BP Location: Left Arm, Patient Position: Sitting, Cuff Size: Normal)   Pulse 81   Temp 98 F (36.7 C) (Oral)   Wt 203 lb (92.1 kg)   SpO2 98%   BMI 30.87 kg/m    CC: 6 mo f/u visit Subjective:    Patient ID: Tim Hawkins, male    DOB: [birth year], 46 y.o.   MRN: [id number]  HPI: Tim Hawkins is a 46 y.o. male presenting on 06/25/2017 for 6 mo follow-up   Recent strep treated with amox.  DM - does not regularly check sugars. Last week post-dinner cbg 160s, 101 pre dinner. Compliant with antihyperglycemic regimen which includes: metformin 500mg  bid, cinnamon. Denies low sugars or hypoglycemic symptoms. Denies paresthesias. Last diabetic eye exam 11/2016. Pneumovax: 10/2014. Prevnar: not due. Glucometer brand: one-touch. DSME: 05/2015. Lab Results  Component Value Date   HGBA1C 6.9 (H) 06/18/2017   Diabetic Foot Exam - Simple   Simple Foot Form Diabetic Foot exam was performed with the following findings:  Yes 06/25/2017  8:34 AM  Visual Inspection No deformities, no ulcerations, no other skin breakdown bilaterally:  Yes Sensation Testing Intact to touch and monofilament testing bilaterally:  Yes Pulse Check Posterior Tibialis and Dorsalis pulse intact bilaterally:  Yes Comments    Lab Results  Component Value Date   MICROALBUR <0.7 12/15/2016     Relevant past medical, surgical, family and social history reviewed and updated as indicated. Interim medical history since our last visit reviewed. Allergies and medications reviewed and updated. Outpatient Medications Prior to Visit  Medication Sig Dispense Refill  . CINNAMON PO Take 1 capsule by mouth daily.    Marland Kitchen glucose blood test strip 1 each by Other route daily. Use to check sugar once daily Dx: E11.9 **One Touch Ultra Mini Blue** 100 each 3  . Multiple Vitamin (ONE-A-DAY MENS PO) Take 1 tablet by mouth daily.    . Omega-3 Fatty Acids (FISH OIL PO) Take 1 capsule by mouth daily.    . Wheat Dextrin  (BENEFIBER) POWD Take by mouth 2 (two) times daily.    Marland Kitchen amoxicillin (AMOXIL) 875 MG tablet Take 1 tablet (875 mg total) by mouth 2 (two) times daily. 20 tablet 0  . metFORMIN (GLUCOPHAGE) 500 MG tablet TAKE 1 TABLET TWICE A DAY WITH MEALS 180 tablet 1   No facility-administered medications prior to visit.      Per HPI unless specifically indicated in ROS section below Review of Systems     Objective:    BP 136/78 (BP Location: Left Arm, Patient Position: Sitting, Cuff Size: Normal)   Pulse 81   Temp 98 F (36.7 C) (Oral)   Wt 203 lb (92.1 kg)   SpO2 98%   BMI 30.87 kg/m   Wt Readings from Last 3 Encounters:  06/25/17 203 lb (92.1 kg)  06/10/17 204 lb 12 oz (92.9 kg)  12/22/16 208 lb 8 oz (94.6 kg)    Physical Exam  Constitutional: He appears well-developed and well-nourished. No distress.  HENT:  Head: Normocephalic and atraumatic.  Right Ear: External ear normal.  Left Ear: External ear normal.  Nose: Nose normal.  Mouth/Throat: Oropharynx is clear and moist. No oropharyngeal exudate.  Eyes: Conjunctivae and EOM are normal. Pupils are equal, round, and reactive to light. No scleral icterus.  Neck: Normal range of motion. Neck supple.  Cardiovascular: Normal rate, regular rhythm, normal heart sounds and intact distal pulses.  No murmur heard. Pulmonary/Chest: Effort normal and breath sounds normal. No  respiratory distress. He has no wheezes. He has no rales.  Musculoskeletal: He exhibits no edema.  See HPI for foot exam if done  Lymphadenopathy:    He has no cervical adenopathy.  Skin: Skin is warm and dry. No rash noted.  Psychiatric: He has a normal mood and affect.  Nursing note and vitals reviewed.  Results for orders placed or performed in visit on 06/18/17  Lipid panel  Result Value Ref Range   Cholesterol 142 0 - 200 mg/dL   Triglycerides 142.0 0.0 - 149.0 mg/dL   HDL 31.30 (L) >39.00 mg/dL   VLDL 28.4 0.0 - 40.0 mg/dL   LDL Cholesterol 83 0 - 99 mg/dL    Total CHOL/HDL Ratio 5    NonHDL 111.06   Hemoglobin A1c  Result Value Ref Range   Hgb A1c MFr Bld 6.9 (H) 4.6 - 6.5 %      Assessment & Plan:   Problem List Items Addressed This Visit    Diabetes type 2, controlled ([place]) - Primary    Chronic, stable. A1c trending up reviewed with patient. Discussed diabetic diet. Continue metformin, cinnamon.       Relevant Medications   metFORMIN (GLUCOPHAGE) 500 MG tablet   Dyslipidemia    Chronic, improved readings. Continue fish oil. Not on statin. The 10-year ASCVD risk score Mikey Bussing DC Brooke Bonito., et al., 2013) is: 6%   Values used to calculate the score:     Age: 52 years     Sex: Male     Is Non-Hispanic African American: No     Diabetic: Yes     Tobacco smoker: No     Systolic Blood Pressure: 622 mmHg     Is BP treated: No     HDL Cholesterol: 31.3 mg/dL     Total Cholesterol: 142 mg/dL       Obesity, Class I, BMI 30-34.9    Continue to encourage weight loss through healthy diet and lifestyle changes. Pt motivated to renew efforts.           Follow up plan: Return in about 6 months (around 12/24/2017) for annual exam, prior fasting for blood work.  Ria Bush, MD

## 2017-06-25 NOTE — Assessment & Plan Note (Signed)
Continue to encourage weight loss through healthy diet and lifestyle changes. Pt motivated to renew efforts.

## 2017-06-25 NOTE — Assessment & Plan Note (Signed)
Chronic, stable. A1c trending up reviewed with patient. Discussed diabetic diet. Continue metformin, cinnamon.

## 2017-06-25 NOTE — Assessment & Plan Note (Signed)
Chronic, improved readings. Continue fish oil. Not on statin. The 10-year ASCVD risk score Mikey Bussing DC Brooke Bonito., et al., 2013) is: 6%   Values used to calculate the score:     Age: 46 years     Sex: Male     Is Non-Hispanic African American: No     Diabetic: Yes     Tobacco smoker: No     Systolic Blood Pressure: 062 mmHg     Is BP treated: No     HDL Cholesterol: 31.3 mg/dL     Total Cholesterol: 142 mg/dL

## 2017-06-25 NOTE — Patient Instructions (Signed)
You are doing well today. Continue current medicines Work towards decreased sugar/sweets in diet to help keep diabetes under control.  No other changes today.

## 2017-07-14 DIAGNOSIS — F341 Dysthymic disorder: Secondary | ICD-10-CM | POA: Diagnosis not present

## 2017-07-28 ENCOUNTER — Telehealth: Payer: Self-pay

## 2017-07-28 DIAGNOSIS — F439 Reaction to severe stress, unspecified: Secondary | ICD-10-CM

## 2017-07-28 NOTE — Telephone Encounter (Signed)
Copied from [place] [phone]. Topic: Referral - Request >> Jul 28, 2017 10:10 AM Synthia Innocent wrote: Reason for [id number], Dr Royal [place]ra Fax # [phone]

## 2017-07-28 NOTE — Telephone Encounter (Signed)
Placed on [facility] workque. They will call pt to schedule

## 2017-07-28 NOTE — Telephone Encounter (Signed)
I spoke with pt and he is requesting referral to Dr Leontine Locket for counseling for personal growth. Pt last seen 06/25/17.Please advise.

## 2017-07-28 NOTE — Telephone Encounter (Signed)
referral placed

## 2017-08-11 ENCOUNTER — Ambulatory Visit: Payer: BLUE CROSS/BLUE SHIELD | Admitting: Psychology

## 2017-08-11 DIAGNOSIS — F411 Generalized anxiety disorder: Secondary | ICD-10-CM | POA: Diagnosis not present

## 2017-08-11 DIAGNOSIS — F4320 Adjustment disorder, unspecified: Secondary | ICD-10-CM | POA: Diagnosis not present

## 2017-08-18 ENCOUNTER — Ambulatory Visit: Payer: BLUE CROSS/BLUE SHIELD | Admitting: Psychology

## 2017-08-18 DIAGNOSIS — F411 Generalized anxiety disorder: Secondary | ICD-10-CM | POA: Diagnosis not present

## 2017-08-25 ENCOUNTER — Ambulatory Visit: Payer: BLUE CROSS/BLUE SHIELD | Admitting: Psychology

## 2017-08-25 ENCOUNTER — Ambulatory Visit: Payer: BLUE CROSS/BLUE SHIELD | Admitting: Licensed Clinical Social Worker

## 2017-08-25 DIAGNOSIS — F411 Generalized anxiety disorder: Secondary | ICD-10-CM

## 2017-08-25 DIAGNOSIS — F4320 Adjustment disorder, unspecified: Secondary | ICD-10-CM | POA: Diagnosis not present

## 2017-09-01 ENCOUNTER — Ambulatory Visit: Payer: BLUE CROSS/BLUE SHIELD | Admitting: Psychology

## 2017-09-03 ENCOUNTER — Ambulatory Visit: Payer: BLUE CROSS/BLUE SHIELD | Admitting: Psychology

## 2017-09-03 DIAGNOSIS — F411 Generalized anxiety disorder: Secondary | ICD-10-CM

## 2017-09-08 ENCOUNTER — Ambulatory Visit: Payer: BLUE CROSS/BLUE SHIELD | Admitting: Psychology

## 2017-09-10 ENCOUNTER — Ambulatory Visit: Payer: BLUE CROSS/BLUE SHIELD | Admitting: Psychology

## 2017-09-10 DIAGNOSIS — F411 Generalized anxiety disorder: Secondary | ICD-10-CM

## 2017-09-10 DIAGNOSIS — F4320 Adjustment disorder, unspecified: Secondary | ICD-10-CM | POA: Diagnosis not present

## 2017-09-15 ENCOUNTER — Ambulatory Visit: Payer: BLUE CROSS/BLUE SHIELD | Admitting: Psychology

## 2017-09-17 ENCOUNTER — Ambulatory Visit: Payer: BLUE CROSS/BLUE SHIELD | Admitting: Psychology

## 2017-09-24 ENCOUNTER — Ambulatory Visit: Payer: BLUE CROSS/BLUE SHIELD | Admitting: Psychology

## 2017-09-24 DIAGNOSIS — F411 Generalized anxiety disorder: Secondary | ICD-10-CM

## 2017-09-24 DIAGNOSIS — F4320 Adjustment disorder, unspecified: Secondary | ICD-10-CM | POA: Diagnosis not present

## 2017-09-28 ENCOUNTER — Encounter: Payer: Self-pay | Admitting: Family Medicine

## 2017-09-28 ENCOUNTER — Ambulatory Visit: Payer: BLUE CROSS/BLUE SHIELD | Admitting: Family Medicine

## 2017-09-28 VITALS — BP 148/80 | HR 84 | Temp 98.3°F | Wt 208.0 lb

## 2017-09-28 DIAGNOSIS — J019 Acute sinusitis, unspecified: Secondary | ICD-10-CM

## 2017-09-28 MED ORDER — AMOXICILLIN-POT CLAVULANATE 875-125 MG PO TABS: 1.0000 | ORAL_TABLET | Freq: Two times a day (BID) | ORAL | 0 refills | Status: AC

## 2017-09-28 NOTE — Patient Instructions (Signed)
You have a sinus infection. Take medicine as prescribed: augmentin antibiotic for 10 days.  Push fluids and plenty of rest. Nasal saline irrigation or neti pot to help drain sinuses. May use plain mucinex with plenty of fluid to help mobilize mucous. Please let us know if fever >101.5, trouble opening/closing mouth, difficulty swallowing, or worsening instead of improving as expected.  Watch blood pressures and blood sugars, let me know if staying elevated even after feeling better.

## 2017-09-28 NOTE — Assessment & Plan Note (Signed)
Anticipate bacterial given duration and progression of symptoms. Treat with augmentin antibiotic and supportive care as per instructions. Anticipate hyperglycemia and recent elevated BP readings related to recent decongestant use and acute illness. Reviewed better OTC treatment options.

## 2017-09-28 NOTE — Progress Notes (Signed)
BP (!) 148/80 (BP Location: Right Arm, Cuff Size: Large)   Pulse 84   Temp 98.3 F (36.8 C) (Oral)   Wt 208 lb (94.3 kg)   SpO2 96%   BMI 31.63 kg/m    CC: sinus congestion Subjective:    Patient ID: Tim Hawkins, male    DOB: [birth year], 47 y.o.   MRN: [id number]  HPI: Robbi Scurlock is a 47 y.o. male presenting on 09/28/2017 for Sinus Problem (Sinus congestion, drainage, sore throat and sinus pressure. Started 09/22/17. Tried Flonase and Neti pot, barely helpful. ); Fever (Max 100, last night.); Cough (Productive cough, worse at night. Also, has chest congestion. Tried Tussin CF, not helpful. Concerned this med has affected his BS. ); and Generalized Body Aches   1 wk h/o ST that progressed to headache, sinus pressure and congestion, progressing to chest congestion, productive cough, fever T max 100, body aches. Has felt feverish. Head > chest congestion. ST is slowly improving. Some dyspnea and wheezing. Trouble laying flat on back due to cough.   No ear or tooth pain. No leg swelling. No dizziness.  Has been treating with flonase, neti pot, OTC tussin CF. This has decongestant in it.   Sugars have been running high. He recently joined gym.   No sick contacts at home.  Non smoker. No h/o asthma   Relevant past medical, surgical, family and social history reviewed and updated as indicated. Interim medical history since our last visit reviewed. Allergies and medications reviewed and updated. Outpatient Medications Prior to Visit  Medication Sig Dispense Refill  . CINNAMON PO Take 1 capsule by mouth daily.    Marland Kitchen glucose blood test strip 1 each by Other route daily. Use to check sugar once daily Dx: E11.9 **One Touch Ultra Mini Blue** 100 each 3  . metFORMIN (GLUCOPHAGE) 500 MG tablet Take 1 tablet (500 mg total) by mouth 2 (two) times daily with a meal. 180 tablet 3  . Multiple Vitamin (ONE-A-DAY MENS PO) Take 1 tablet by mouth daily.    . Omega-3 Fatty Acids (FISH OIL PO)  Take 1 capsule by mouth daily.    . Wheat Dextrin (BENEFIBER) POWD Take by mouth 2 (two) times daily.     No facility-administered medications prior to visit.      Per HPI unless specifically indicated in ROS section below Review of Systems     Objective:    BP (!) 148/80 (BP Location: Right Arm, Cuff Size: Large)   Pulse 84   Temp 98.3 F (36.8 C) (Oral)   Wt 208 lb (94.3 kg)   SpO2 96%   BMI 31.63 kg/m   Wt Readings from Last 3 Encounters:  09/28/17 208 lb (94.3 kg)  06/25/17 203 lb (92.1 kg)  06/10/17 204 lb 12 oz (92.9 kg)    Physical Exam  Constitutional: He appears well-developed and well-nourished. No distress.  HENT:  Head: Normocephalic and atraumatic.  Right Ear: Hearing, tympanic membrane, external ear and ear canal normal.  Left Ear: Hearing, tympanic membrane, external ear and ear canal normal.  Nose: Mucosal edema and rhinorrhea present. Right sinus exhibits no maxillary sinus tenderness and no frontal sinus tenderness. Left sinus exhibits no maxillary sinus tenderness and no frontal sinus tenderness.  Mouth/Throat: Uvula is midline and mucous membranes are normal. Posterior oropharyngeal erythema present. No oropharyngeal exudate, posterior oropharyngeal edema or tonsillar abscesses.  Eyes: Pupils are equal, round, and reactive to light. Conjunctivae and EOM are normal. No scleral icterus.  Neck:  Normal range of motion. Neck supple.  Cardiovascular: Normal rate, regular rhythm, normal heart sounds and intact distal pulses.  No murmur heard. Pulmonary/Chest: Effort normal. No respiratory distress. He has no decreased breath sounds. He has no wheezes. He has rhonchi (faint). He has no rales.  Lymphadenopathy:    He has no cervical adenopathy.  Skin: Skin is warm and dry. No rash noted.  Nursing note and vitals reviewed.     Assessment & Plan:   Problem List Items Addressed This Visit    Acute sinusitis - Primary    Anticipate bacterial given duration and  progression of symptoms. Treat with augmentin antibiotic and supportive care as per instructions. Anticipate hyperglycemia and recent elevated BP readings related to recent decongestant use and acute illness. Reviewed better OTC treatment options.       Relevant Medications   amoxicillin-clavulanate (AUGMENTIN) 875-125 MG tablet       Meds ordered this encounter  Medications  . amoxicillin-clavulanate (AUGMENTIN) 875-125 MG tablet    Sig: Take 1 tablet by mouth 2 (two) times daily for 10 days.    Dispense:  20 tablet    Refill:  0   No orders of the defined types were placed in this encounter.   Follow up plan: Return if symptoms worsen or fail to improve.  Ria Bush, MD

## 2017-10-01 ENCOUNTER — Ambulatory Visit: Payer: BLUE CROSS/BLUE SHIELD | Admitting: Psychology

## 2017-10-08 ENCOUNTER — Ambulatory Visit: Payer: BLUE CROSS/BLUE SHIELD | Admitting: Psychology

## 2017-10-22 ENCOUNTER — Ambulatory Visit: Payer: BLUE CROSS/BLUE SHIELD | Admitting: Psychology

## 2017-10-22 DIAGNOSIS — F411 Generalized anxiety disorder: Secondary | ICD-10-CM

## 2017-10-29 ENCOUNTER — Ambulatory Visit: Payer: BLUE CROSS/BLUE SHIELD | Admitting: Psychology

## 2017-10-29 DIAGNOSIS — F411 Generalized anxiety disorder: Secondary | ICD-10-CM

## 2017-11-05 ENCOUNTER — Ambulatory Visit: Payer: BLUE CROSS/BLUE SHIELD | Admitting: Psychology

## 2017-11-12 ENCOUNTER — Ambulatory Visit: Payer: BLUE CROSS/BLUE SHIELD | Admitting: Psychology

## 2017-11-12 DIAGNOSIS — F411 Generalized anxiety disorder: Secondary | ICD-10-CM

## 2017-11-19 ENCOUNTER — Ambulatory Visit: Payer: BLUE CROSS/BLUE SHIELD | Admitting: Psychology

## 2017-11-19 DIAGNOSIS — F411 Generalized anxiety disorder: Secondary | ICD-10-CM

## 2017-11-26 ENCOUNTER — Ambulatory Visit: Payer: BLUE CROSS/BLUE SHIELD | Admitting: Family Medicine

## 2017-11-26 ENCOUNTER — Encounter: Payer: Self-pay | Admitting: Family Medicine

## 2017-11-26 ENCOUNTER — Ambulatory Visit: Payer: BLUE CROSS/BLUE SHIELD | Admitting: Psychology

## 2017-11-26 VITALS — BP 140/78 | HR 100 | Temp 99.1°F | Ht 68.0 in | Wt 212.0 lb

## 2017-11-26 DIAGNOSIS — J309 Allergic rhinitis, unspecified: Secondary | ICD-10-CM

## 2017-11-26 DIAGNOSIS — R0981 Nasal congestion: Secondary | ICD-10-CM | POA: Diagnosis not present

## 2017-11-26 DIAGNOSIS — R05 Cough: Secondary | ICD-10-CM | POA: Diagnosis not present

## 2017-11-26 DIAGNOSIS — R059 Cough, unspecified: Secondary | ICD-10-CM

## 2017-11-26 MED ORDER — BENZONATATE 100 MG PO CAPS: 100.0000 mg | ORAL_CAPSULE | Freq: Three times a day (TID) | ORAL | 0 refills | Status: DC | PRN

## 2017-11-26 MED ORDER — TRIAMCINOLONE ACETONIDE 55 MCG/ACT NA AERO: 2.0000 | INHALATION_SPRAY | Freq: Every day | NASAL | 12 refills | Status: DC

## 2017-11-26 NOTE — Progress Notes (Signed)
Subjective:    Patient ID: Tim Hawkins, male    DOB: 1970/10/14, 47 y.o.   MRN: [id number]  HPI This is a 47 yo male who presents today with 3 days of sinus drainage and congestion. No headaches. Mild sore throat, cough with clear sputum, feels SOB from nasal congestion. Feels fatigued, no muscle aches. Subjective low grade fever. No known sick contacts.  Was treated last month for sinus infection with augmentin. Works outside to unload trucks.  Using his Neti pot once a day. Only takes Flonase "when I need it." Takes Claritin occasionally when he mows.  Blood sugar 152 this week, rarely over 200. Drinking 2 bottles of water a day.   Past Medical History:  Diagnosis Date  . Diabetes type 2, controlled ([place]) 06/20/2013   DSME 05/2015  . Fatty liver 08/2012   hepatic steatosis per abd [place] at [facility]  . Obesity 08/30/2012  . 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 VASECTOMY  12/2015   [place]   Family History  Problem Relation Age of Onset  . Diabetes Father 87  . CAD Father 6       MI  . Diabetes Mother        Pre-diabetes  . Cancer Neg Hx   . Stroke Neg Hx   . Kidney disease Neg Hx   . Prostate cancer Neg Hx    Social History   Tobacco Use  . Smoking status: Former Smoker    Packs/day: 0.25    Years: 5.00    Pack years: 1.25    Types: Cigars    Last attempt to quit: 07/30/2012    Years since quitting: 5.3  . Smokeless tobacco: Current User  . Tobacco comment: e cig  Substance Use Topics  . Alcohol use: Yes    Alcohol/week: 0.0 oz    Comment: Rare 1-2 per month  . Drug use: No      Review of Systems Per HPI    Objective:   Physical Exam  Constitutional: He is oriented to person, place, and time. He appears well-developed and well-nourished. No distress.  HENT:  Head: Normocephalic and atraumatic.  Right Ear: Tympanic membrane, external ear and ear canal normal.  Left Ear: Tympanic membrane, external ear and  ear canal normal.  Nose: Mucosal edema and rhinorrhea present.  Mouth/Throat: Mucous membranes are normal. Posterior oropharyngeal erythema present. No oropharyngeal exudate, posterior oropharyngeal edema or tonsillar abscesses.  Frequent nasal snorting.   Eyes: Conjunctivae are normal.  Neck: Normal range of motion. Neck supple.  Cardiovascular: Normal rate, regular rhythm and normal heart sounds.  Pulmonary/Chest: Effort normal and breath sounds normal.  Lymphadenopathy:    He has no cervical adenopathy.  Neurological: He is alert and oriented to person, place, and time.  Skin: Skin is warm and dry. He is not diaphoretic.  Psychiatric: He has a normal mood and affect. His behavior is normal. Judgment and thought content normal.  Vitals reviewed.     BP (!) 160/82 (BP Location: Left Arm, Patient Position: Sitting, Cuff Size: Large)   Pulse 100   Temp 99.1 F (37.3 C) (Oral)   Ht 5\' 8"  (1.727 m)   Wt 212 lb (96.2 kg)   SpO2 97%   BMI 32.23 kg/m  Wt Readings from Last 3 Encounters:  11/26/17 212 lb (96.2 kg)  09/28/17 208 lb (94.3 kg)  06/25/17 203 lb (92.1 kg)   BP Readings from Last 3  Encounters:  11/26/17 (!) 160/82  09/28/17 (!) 148/80  06/25/17 136/78   Recheck BP- 142/78    Assessment & Plan:  1. Sinus congestion - suspect allergic rhinitis vs viral illness with clear, watery nasal drainage, no headache -  Patient Instructions  Please take a daily over the counter antihstamine  Can use nasal spray twice a day for 5 days, then use at bedtime  If not better in 5-7 days, please let me know   Drink enough liquids to make your urine light yellow    2. Cough - benzonatate (TESSALON) 100 MG capsule; Take 1-2 capsules (100-200 mg total) by mouth 3 (three) times daily as needed.  Dispense: 40 capsule; Refill: 0  3. Allergic rhinitis, unspecified seasonality, unspecified trigger - triamcinolone (NASACORT) 55 MCG/ACT AERO nasal inhaler; Place 2 sprays into the nose  daily.  Dispense: 1 Inhaler; Refill: 12 - continue neti pot, add daily antihistamine - RTC precautions reviwed  Clarene Reamer, FNP-BC  [facility], [place]  11/26/2017 4:15 PM

## 2017-11-26 NOTE — Patient Instructions (Signed)
Please take a daily over the counter antihstamine  Can use nasal spray twice a day for 5 days, then use at bedtime  If not better in 5-7 days, please let me know   Drink enough liquids to make your urine light yellow

## 2017-12-03 ENCOUNTER — Ambulatory Visit: Payer: BLUE CROSS/BLUE SHIELD | Admitting: Psychology

## 2017-12-10 ENCOUNTER — Ambulatory Visit: Payer: BLUE CROSS/BLUE SHIELD | Admitting: Psychology

## 2017-12-17 ENCOUNTER — Ambulatory Visit: Payer: BLUE CROSS/BLUE SHIELD | Admitting: Psychology

## 2017-12-24 ENCOUNTER — Ambulatory Visit: Payer: BLUE CROSS/BLUE SHIELD | Admitting: Psychology

## 2018-02-07 ENCOUNTER — Ambulatory Visit: Payer: BLUE CROSS/BLUE SHIELD | Admitting: Psychology

## 2018-02-14 ENCOUNTER — Ambulatory Visit: Payer: BLUE CROSS/BLUE SHIELD | Admitting: Psychology

## 2018-02-14 DIAGNOSIS — F411 Generalized anxiety disorder: Secondary | ICD-10-CM

## 2018-02-21 ENCOUNTER — Ambulatory Visit: Payer: BLUE CROSS/BLUE SHIELD | Admitting: Psychology

## 2018-02-21 DIAGNOSIS — F411 Generalized anxiety disorder: Secondary | ICD-10-CM | POA: Diagnosis not present

## 2018-02-27 LAB — HM DIABETES EYE EXAM

## 2018-03-07 ENCOUNTER — Ambulatory Visit: Payer: BLUE CROSS/BLUE SHIELD | Admitting: Psychology

## 2018-03-07 DIAGNOSIS — F411 Generalized anxiety disorder: Secondary | ICD-10-CM

## 2018-03-21 ENCOUNTER — Ambulatory Visit: Payer: BLUE CROSS/BLUE SHIELD | Admitting: Psychology

## 2018-03-27 ENCOUNTER — Other Ambulatory Visit: Payer: Self-pay | Admitting: Family Medicine

## 2018-03-27 DIAGNOSIS — E119 Type 2 diabetes mellitus without complications: Secondary | ICD-10-CM

## 2018-03-27 DIAGNOSIS — E785 Hyperlipidemia, unspecified: Secondary | ICD-10-CM

## 2018-03-28 ENCOUNTER — Other Ambulatory Visit (INDEPENDENT_AMBULATORY_CARE_PROVIDER_SITE_OTHER): Payer: BLUE CROSS/BLUE SHIELD

## 2018-03-28 DIAGNOSIS — E785 Hyperlipidemia, unspecified: Secondary | ICD-10-CM | POA: Diagnosis not present

## 2018-03-28 DIAGNOSIS — E119 Type 2 diabetes mellitus without complications: Secondary | ICD-10-CM | POA: Diagnosis not present

## 2018-03-28 LAB — COMPREHENSIVE METABOLIC PANEL
ALK PHOS: 72 U/L (ref 39–117)
ALT: 25 U/L (ref 0–53)
AST: 19 U/L (ref 0–37)
Albumin: 4.3 g/dL (ref 3.5–5.2)
BILIRUBIN TOTAL: 0.4 mg/dL (ref 0.2–1.2)
BUN: 21 mg/dL (ref 6–23)
CO2: 29 mEq/L (ref 19–32)
CREATININE: 0.51 mg/dL (ref 0.40–1.50)
Calcium: 9.7 mg/dL (ref 8.4–10.5)
Chloride: 101 mEq/L (ref 96–112)
GFR: 183.51 mL/min (ref >60.00)
GLUCOSE: 146 mg/dL — AB (ref 70–99)
POTASSIUM: 4.8 meq/L (ref 3.5–5.1)
SODIUM: 136 meq/L (ref 135–145)
Total Protein: 7.3 g/dL (ref 6.0–8.3)

## 2018-03-28 LAB — MICROALBUMIN / CREATININE URINE RATIO
Creatinine,U: 29.6 mg/dL
Microalb Creat Ratio: 2.4 mg/g (ref 0.0–30.0)

## 2018-03-28 LAB — LIPID PANEL
CHOLESTEROL: 173 mg/dL (ref 0–200)
HDL: 31.1 mg/dL — ABNORMAL LOW (ref >39.00)
NonHDL: 142
Total CHOL/HDL Ratio: 6
Triglycerides: 259 mg/dL — ABNORMAL HIGH (ref 0.0–149.0)
VLDL: 51.8 mg/dL — ABNORMAL HIGH (ref 0.0–40.0)

## 2018-03-28 LAB — HEMOGLOBIN A1C: HEMOGLOBIN A1C: 7.4 % — AB (ref 4.6–6.5)

## 2018-03-28 LAB — LDL CHOLESTEROL, DIRECT: LDL DIRECT: 95 mg/dL

## 2018-04-04 ENCOUNTER — Ambulatory Visit (INDEPENDENT_AMBULATORY_CARE_PROVIDER_SITE_OTHER): Payer: BLUE CROSS/BLUE SHIELD | Admitting: Family Medicine

## 2018-04-04 ENCOUNTER — Encounter: Payer: Self-pay | Admitting: Family Medicine

## 2018-04-04 VITALS — BP 138/76 | HR 74 | Temp 98.4°F | Ht 66.5 in | Wt 209.2 lb

## 2018-04-04 DIAGNOSIS — Z23 Encounter for immunization: Secondary | ICD-10-CM | POA: Diagnosis not present

## 2018-04-04 DIAGNOSIS — L989 Disorder of the skin and subcutaneous tissue, unspecified: Secondary | ICD-10-CM

## 2018-04-04 DIAGNOSIS — E785 Hyperlipidemia, unspecified: Secondary | ICD-10-CM

## 2018-04-04 DIAGNOSIS — E118 Type 2 diabetes mellitus with unspecified complications: Secondary | ICD-10-CM

## 2018-04-04 DIAGNOSIS — K760 Fatty (change of) liver, not elsewhere classified: Secondary | ICD-10-CM

## 2018-04-04 DIAGNOSIS — E669 Obesity, unspecified: Secondary | ICD-10-CM

## 2018-04-04 DIAGNOSIS — IMO0002 Reserved for concepts with insufficient information to code with codable children: Secondary | ICD-10-CM

## 2018-04-04 DIAGNOSIS — F439 Reaction to severe stress, unspecified: Secondary | ICD-10-CM

## 2018-04-04 DIAGNOSIS — Z0000 Encounter for general adult medical examination without abnormal findings: Secondary | ICD-10-CM | POA: Diagnosis not present

## 2018-04-04 DIAGNOSIS — E1165 Type 2 diabetes mellitus with hyperglycemia: Secondary | ICD-10-CM

## 2018-04-04 NOTE — Assessment & Plan Note (Signed)
Preventative protocols reviewed and updated unless pt declined. Discussed healthy diet and lifestyle.  

## 2018-04-04 NOTE — Assessment & Plan Note (Signed)
LFTs stable.

## 2018-04-04 NOTE — Assessment & Plan Note (Signed)
Reviewed healthy diet and lifestyle changes to affect sustainable weight loss.  

## 2018-04-04 NOTE — Assessment & Plan Note (Signed)
Requests derm referral for growth under L eye

## 2018-04-04 NOTE — Assessment & Plan Note (Signed)
Chronic ,deteriorated with worse sugar control - pt will work towards sugar and lipid control over next 6 months then will reassess. The 10-year ASCVD risk score Mikey Bussing DC Brooke Bonito., et al., 2013) is: 9.1%   Values used to calculate the score:     Age: 47 years     Sex: Male     Is Non-Hispanic African American: No     Diabetic: Yes     Tobacco smoker: No     Systolic Blood Pressure: 241 mmHg     Is BP treated: No     HDL Cholesterol: 31.1 mg/dL     Total Cholesterol: 173 mg/dL

## 2018-04-04 NOTE — Patient Instructions (Addendum)
Flu shot today Work on low sugar low carb diet for better diabetes control. Good to see you today, return in 6 months for follow up diabetes visit. We will refer you to dermatologist.   Health Maintenance, Male A healthy lifestyle and preventive care is important for your health and wellness. Ask your health care provider about what schedule of regular examinations is right for you. What should I know about weight and diet? Eat a Healthy Diet  Eat plenty of vegetables, fruits, whole grains, low-fat dairy products, and lean protein.  Do not eat a lot of foods high in solid fats, added sugars, or salt.  Maintain a Healthy Weight Regular exercise can help you achieve or maintain a healthy weight. You should:  Do at least 150 minutes of exercise each week. The exercise should increase your heart rate and make you sweat (moderate-intensity exercise).  Do strength-training exercises at least twice a week.  Watch Your Levels of Cholesterol and Blood Lipids  Have your blood tested for lipids and cholesterol every 5 years starting at 49 years of age. If you are at high risk for heart disease, you should start having your blood tested when you are 49 years old. You may need to have your cholesterol levels checked more often if: ? Your lipid or cholesterol levels are high. ? You are older than 49 years of age. ? You are at high risk for heart disease.  What should I know about cancer screening? Many types of cancers can be detected early and may often be prevented. Lung Cancer  You should be screened every year for lung cancer if: ? You are a current smoker who has smoked for at least 30 years. ? You are a former smoker who has quit within the past 15 years.  Talk to your health care provider about your screening options, when you should start screening, and how often you should be screened.  Colorectal Cancer  Routine colorectal cancer screening usually begins at 49 years of age and  should be repeated every 5-10 years until you are 49 years old. You may need to be screened more often if early forms of precancerous polyps or small growths are found. Your health care provider may recommend screening at an earlier age if you have risk factors for colon cancer.  Your health care provider may recommend using home test kits to check for hidden blood in the stool.  A small camera at the end of a tube can be used to examine your colon (sigmoidoscopy or colonoscopy). This checks for the earliest forms of colorectal cancer.  Prostate and Testicular Cancer  Depending on your age and overall health, your health care provider may do certain tests to screen for prostate and testicular cancer.  Talk to your health care provider about any symptoms or concerns you have about testicular or prostate cancer.  Skin Cancer  Check your skin from head to toe regularly.  Tell your health care provider about any new moles or changes in moles, especially if: ? There is a change in a mole's size, shape, or color. ? You have a mole that is larger than a pencil eraser.  Always use sunscreen. Apply sunscreen liberally and repeat throughout the day.  Protect yourself by wearing long sleeves, pants, a wide-brimmed hat, and sunglasses when outside.  What should I know about heart disease, diabetes, and high blood pressure?  If you are 50-40 years of age, have your blood pressure checked every 3-5  years. If you are 92 years of age or older, have your blood pressure checked every year. You should have your blood pressure measured twice-once when you are at a hospital or clinic, and once when you are not at a hospital or clinic. Record the average of the two measurements. To check your blood pressure when you are not at a hospital or clinic, you can use: ? An automated blood pressure machine at a pharmacy. ? A home blood pressure monitor.  Talk to your health care provider about your target blood  pressure.  If you are between 67-68 years old, ask your health care provider if you should take aspirin to prevent heart disease.  Have regular diabetes screenings by checking your fasting blood sugar level. ? If you are at a normal weight and have a low risk for diabetes, have this test once every three years after the age of 66. ? If you are overweight and have a high risk for diabetes, consider being tested at a younger age or more often.  A one-time screening for abdominal aortic aneurysm (AAA) by ultrasound is recommended for men aged 69-75 years who are current or former smokers. What should I know about preventing infection? Hepatitis B If you have a higher risk for hepatitis B, you should be screened for this virus. Talk with your health care provider to find out if you are at risk for hepatitis B infection. Hepatitis C Blood testing is recommended for:  Everyone born from [birth year] through 1967.  Anyone with known risk factors for hepatitis C.  Sexually Transmitted Diseases (STDs)  You should be screened each year for STDs including gonorrhea and chlamydia if: ? You are sexually active and are younger than 49 years of age. ? You are older than 49 years of age and your health care provider tells you that you are at risk for this type of infection. ? Your sexual activity has changed since you were last screened and you are at an increased risk for chlamydia or gonorrhea. Ask your health care provider if you are at risk.  Talk with your health care provider about whether you are at high risk of being infected with HIV. Your health care provider may recommend a prescription medicine to help prevent HIV infection.  What else can I do?  Schedule regular health, dental, and eye exams.  Stay current with your vaccines (immunizations).  Do not use any tobacco products, such as cigarettes, chewing tobacco, and e-cigarettes. If you need help quitting, ask your health care  provider.  Limit alcohol intake to no more than 2 drinks per day. One drink equals 12 ounces of beer, 5 ounces of wine, or 1 ounces of hard liquor.  Do not use street drugs.  Do not share needles.  Ask your health care provider for help if you need support or information about quitting drugs.  Tell your health care provider if you often feel depressed.  Tell your health care provider if you have ever been abused or do not feel safe at home. This information is not intended to replace advice given to you by your health care provider. Make sure you discuss any questions you have with your health care provider. Document Released: 12/12/2007 Document Revised: 02/12/2016 Document Reviewed: 03/19/2015 Elsevier Interactive Patient Education  [employer].

## 2018-04-04 NOTE — Assessment & Plan Note (Signed)
Increased recently. Discussed healthy stress relieving strategies.

## 2018-04-04 NOTE — Assessment & Plan Note (Signed)
Chronic, deteriorated control based on A1c. Pt motivated to improve diet and work on exercise routine to improve glycemic control. RTC 6 mo recheck DM visit

## 2018-04-04 NOTE — Progress Notes (Signed)
BP 138/76 (BP Location: Left Arm, Patient Position: Sitting, Cuff Size: Normal)   Pulse 74   Temp 98.4 F (36.9 C) (Oral)   Ht 5' 6.5" (1.689 m)   Wt 209 lb 4 oz (94.9 kg)   SpO2 97%   BMI 33.27 kg/m    CC: CPE Subjective:    Patient ID: Tim Hawkins, male    DOB: 01/25/1971, 47 y.o.   MRN: [id number]  HPI: Tim Hawkins is a 47 y.o. male presenting on 04/04/2018 for Annual Exam (Wants moles checked. Also, wants to discuss referral to dermatology to remove growth under left eye.)   Upcoming vacation next week to [place].  Increased work and home stress.  Seeing our counselor Tim Hawkins twice monthly and he finds this beneficial.   Preventative: Flu shot yearly Pneumovax 2016 Tdap 2016 Seat belt use discussed Sunscreen use discussed, no changing moles on skin.  Ex smoker - quit 2014 Alcohol - rare  Dentist Q6 mo  Eye exam yearly  Lives with wife Tim Hawkins. No pets  Occupation: truck Geophysicist/field seismologist  Activity: tries to walk regularly 30 min  Diet: good water, tries daily fruits/vegetables   Relevant past medical, surgical, family and social history reviewed and updated as indicated. Interim medical history since our last visit reviewed. Allergies and medications reviewed and updated. Outpatient Medications Prior to Visit  Medication Sig Dispense Refill  . ASPIRIN 81 PO Take 1 tablet by mouth daily.    Marland Kitchen CINNAMON PO Take 1 capsule by mouth daily.    Marland Kitchen glucose blood test strip 1 each by Other route daily. Use to check sugar once daily Dx: E11.9 **One Touch Ultra Mini Blue** 100 each 3  . metFORMIN (GLUCOPHAGE) 500 MG tablet Take 1 tablet (500 mg total) by mouth 2 (two) times daily with a meal. 180 tablet 3  . Multiple Vitamin (ONE-A-DAY MENS PO) Take 1 tablet by mouth daily.    . Omega-3 Fatty Acids (FISH OIL PO) Take 1 capsule by mouth daily.    Marland Kitchen triamcinolone (NASACORT) 55 MCG/ACT AERO nasal inhaler Place 2 sprays into the nose daily. 1 Inhaler 12  . Wheat Dextrin  (BENEFIBER) POWD Take by mouth 2 (two) times daily.    . benzonatate (TESSALON) 100 MG capsule Take 1-2 capsules (100-200 mg total) by mouth 3 (three) times daily as needed. 40 capsule 0   No facility-administered medications prior to visit.      Per HPI unless specifically indicated in ROS section below Review of Systems  Constitutional: Negative for activity change, appetite change, chills, fatigue, fever and unexpected weight change.  HENT: Negative for hearing loss.   Eyes: Negative for visual disturbance.  Respiratory: Negative for cough, chest tightness, shortness of breath and wheezing.   Cardiovascular: Negative for chest pain, palpitations and leg swelling.  Gastrointestinal: Negative for abdominal distention, abdominal pain, blood in stool, constipation, diarrhea, nausea and vomiting.  Genitourinary: Negative for difficulty urinating and hematuria.  Musculoskeletal: Negative for arthralgias, myalgias and neck pain.  Skin: Negative for rash.  Neurological: Negative for dizziness, seizures, syncope and headaches.  Hematological: Negative for adenopathy. Does not bruise/bleed easily.  Psychiatric/Behavioral: Negative for dysphoric mood. The patient is not nervous/anxious.        Objective:    BP 138/76 (BP Location: Left Arm, Patient Position: Sitting, Cuff Size: Normal)   Pulse 74   Temp 98.4 F (36.9 C) (Oral)   Ht 5' 6.5" (1.689 m)   Wt 209 lb 4 oz (94.9 kg)  SpO2 97%   BMI 33.27 kg/m   Wt Readings from Last 3 Encounters:  04/04/18 209 lb 4 oz (94.9 kg)  11/26/17 212 lb (96.2 kg)  09/28/17 208 lb (94.3 kg)    Physical Exam  Constitutional: He is oriented to person, place, and time. He appears well-developed and well-nourished. No distress.  HENT:  Head: Normocephalic and atraumatic.  Right Ear: Hearing, tympanic membrane, external ear and ear canal normal.  Left Ear: Hearing, tympanic membrane, external ear and ear canal normal.  Nose: Nose normal.    Mouth/Throat: Uvula is midline, oropharynx is clear and moist and mucous membranes are normal. No oropharyngeal exudate, posterior oropharyngeal edema or posterior oropharyngeal erythema.  Eyes: Pupils are equal, round, and reactive to light. Conjunctivae and EOM are normal. No scleral icterus.  Neck: Normal range of motion. Neck supple.  Cardiovascular: Normal rate, regular rhythm, normal heart sounds and intact distal pulses.  No murmur heard. Pulses:      Radial pulses are 2+ on the right side, and 2+ on the left side.  Pulmonary/Chest: Effort normal and breath sounds normal. No respiratory distress. He has no wheezes. He has no rales.  Abdominal: Soft. Bowel sounds are normal. He exhibits no distension and no mass. There is no tenderness. There is no rebound and no guarding.  Musculoskeletal: Normal range of motion. He exhibits no edema.  Lymphadenopathy:    He has no cervical adenopathy.  Neurological: He is alert and oriented to person, place, and time.  CN grossly intact, station and gait intact  Skin: Skin is warm and dry. No rash noted.  Skin tag-like growth at L inferior eye  Psychiatric: He has a normal mood and affect. His behavior is normal. Judgment and thought content normal.  Nursing note and vitals reviewed.  Results for orders placed or performed in visit on 04/04/18  [facility] DIABETES EYE EXAM  Result Value Ref Range   HM Diabetic Eye Exam No Retinopathy No Retinopathy      Assessment & Plan:   Problem List Items Addressed This Visit    Stress    Increased recently. Discussed healthy stress relieving strategies.       Obesity, Class I, BMI 30-34.9    Reviewed healthy diet and lifestyle changes to affect sustainable weight loss.       Hepatic steatosis    LFTs stable.       Health maintenance examination - Primary    Preventative protocols reviewed and updated unless pt declined. Discussed healthy diet and lifestyle.       Dyslipidemia    Chronic  ,deteriorated with worse sugar control - pt will work towards sugar and lipid control over next 6 months then will reassess. The 10-year ASCVD risk score Mikey Bussing DC Brooke Bonito., et al., 2013) is: 9.1%   Values used to calculate the score:     Age: 33 years     Sex: Male     Is Non-Hispanic African American: No     Diabetic: Yes     Tobacco smoker: No     Systolic Blood Pressure: 646 mmHg     Is BP treated: No     HDL Cholesterol: 31.1 mg/dL     Total Cholesterol: 173 mg/dL       Diabetes mellitus type 2, uncontrolled, with complications (HCC)    Chronic, deteriorated control based on A1c. Pt motivated to improve diet and work on exercise routine to improve glycemic control. RTC 6 mo recheck DM visit  Relevant Medications   ASPIRIN 81 PO   Benign skin growth    Requests derm referral for growth under L eye       Relevant Orders   Ambulatory referral to Dermatology    Other Visit Diagnoses    Need for influenza vaccination       Relevant Orders   Flu Vaccine QUAD 36+ mos IM (Completed)       No orders of the defined types were placed in this encounter.  Orders Placed This Encounter  Procedures  . Flu Vaccine QUAD 36+ mos IM  . Ambulatory referral to Dermatology    Referral Priority:   Routine    Referral Type:   Consultation    Referral Reason:   Specialty Services Required    Requested Specialty:   Dermatology    Number of Visits Requested:   1  . [facility] DIABETES EYE EXAM    This external order was created through the Results Console.    Follow up plan: Return in about 6 months (around 10/04/2018) for follow up visit.  Ria Bush, MD

## 2018-04-25 ENCOUNTER — Encounter: Payer: Self-pay | Admitting: Family Medicine

## 2018-07-05 DIAGNOSIS — B353 Tinea pedis: Secondary | ICD-10-CM | POA: Diagnosis not present

## 2018-07-05 DIAGNOSIS — B351 Tinea unguium: Secondary | ICD-10-CM | POA: Diagnosis not present

## 2018-07-05 DIAGNOSIS — B359 Dermatophytosis, unspecified: Secondary | ICD-10-CM | POA: Diagnosis not present

## 2018-07-05 DIAGNOSIS — L820 Inflamed seborrheic keratosis: Secondary | ICD-10-CM | POA: Diagnosis not present

## 2018-07-05 DIAGNOSIS — B352 Tinea manuum: Secondary | ICD-10-CM | POA: Diagnosis not present

## 2018-07-05 DIAGNOSIS — D2239 Melanocytic nevi of other parts of face: Secondary | ICD-10-CM | POA: Diagnosis not present

## 2018-07-05 DIAGNOSIS — Z79899 Other long term (current) drug therapy: Secondary | ICD-10-CM | POA: Diagnosis not present

## 2018-07-14 ENCOUNTER — Ambulatory Visit: Payer: BLUE CROSS/BLUE SHIELD | Admitting: Family Medicine

## 2018-07-14 ENCOUNTER — Encounter: Payer: Self-pay | Admitting: Family Medicine

## 2018-07-14 VITALS — BP 124/70 | HR 74 | Temp 98.0°F | Ht 66.5 in | Wt 212.5 lb

## 2018-07-14 DIAGNOSIS — J019 Acute sinusitis, unspecified: Secondary | ICD-10-CM

## 2018-07-14 NOTE — Patient Instructions (Addendum)
You have a sinus infection likely viral. Take flonase nasal steroid. Take ibuprofen 400-600mg  with meals for next 3-5 days.  May use plain mucinex with plenty of fluid to help mobilize mucous. Continue neti pot.  Push fluids and plenty of rest. Let us know if fever >101, worsening facial pain not improving, or ongoing symptoms past 7-10 days.

## 2018-07-14 NOTE — Progress Notes (Signed)
BP 124/70 (BP Location: Left Arm, Patient Position: Sitting, Cuff Size: Normal)   Pulse 74   Temp 98 F (36.7 C) (Oral)   Ht 5' 6.5" (1.689 m)   Wt 212 lb 8 oz (96.4 kg)   SpO2 98%   BMI 33.78 kg/m    CC: ?sinus infection Subjective:    Patient ID: Tim Hawkins, male    DOB: 14-Apr-1971, 48 y.o.   MRN: [id number]  HPI: Tim Hawkins is a 48 y.o. male presenting on 07/14/2018 for Sinus Problem (C/o facial pain, teeth pain, sinus congestion. Sxs started about 3 days ago. Tried Neti pot, barely helpful. )   3d h/o sinus congestion, L>R maxillary pain and pressure, tooth pain, HA.   Denies fevers/chills, ear pain, cough, ST, PNdrainage, dyspnea.   So far has tried neti pot, OTC sinus medicine.  No sick contacts at home.  Ex smoker. Not dipping or chewing.  No h/o asthma.   Sugars running better controlled recently. Lab Results  Component Value Date   HGBA1C 7.4 (H) 03/28/2018        Relevant past medical, surgical, family and social history reviewed and updated as indicated. Interim medical history since our last visit reviewed. Allergies and medications reviewed and updated. Outpatient Medications Prior to Visit  Medication Sig Dispense Refill  . ASPIRIN 81 PO Take 1 tablet by mouth daily.    Marland Kitchen CINNAMON PO Take 1 capsule by mouth daily.    Marland Kitchen glucose blood test strip 1 each by Other route daily. Use to check sugar once daily Dx: E11.9 **One Touch Ultra Mini Blue** 100 each 3  . ketoconazole (NIZORAL) 2 % cream Apply 1 application topically 2 (two) times daily.    . metFORMIN (GLUCOPHAGE) 500 MG tablet Take 1 tablet (500 mg total) by mouth 2 (two) times daily with a meal. 180 tablet 3  . Multiple Vitamin (ONE-A-DAY MENS PO) Take 1 tablet by mouth daily.    . Omega-3 Fatty Acids (FISH OIL PO) Take 1 capsule by mouth daily.    Marland Kitchen terbinafine (LAMISIL) 250 MG tablet Take 1 tablet by mouth daily. Takes daily with food    . triamcinolone (NASACORT) 55 MCG/ACT AERO nasal  inhaler Place 2 sprays into the nose daily. 1 Inhaler 12  . Wheat Dextrin (BENEFIBER) POWD Take by mouth 2 (two) times daily.     No facility-administered medications prior to visit.      Per HPI unless specifically indicated in ROS section below Review of Systems Objective:    BP 124/70 (BP Location: Left Arm, Patient Position: Sitting, Cuff Size: Normal)   Pulse 74   Temp 98 F (36.7 C) (Oral)   Ht 5' 6.5" (1.689 m)   Wt 212 lb 8 oz (96.4 kg)   SpO2 98%   BMI 33.78 kg/m   Wt Readings from Last 3 Encounters:  07/14/18 212 lb 8 oz (96.4 kg)  04/04/18 209 lb 4 oz (94.9 kg)  11/26/17 212 lb (96.2 kg)    Physical Exam Vitals signs and nursing note reviewed.  Constitutional:      General: He is not in acute distress.    Appearance: Normal appearance. He is well-developed.  HENT:     Head: Normocephalic and atraumatic.     Right Ear: Hearing, tympanic membrane, ear canal and external ear normal.     Left Ear: Hearing, tympanic membrane, ear canal and external ear normal.     Nose: Mucosal edema (nasal mucosal erythema and congestion) and  congestion present. No rhinorrhea.     Right Sinus: No maxillary sinus tenderness or frontal sinus tenderness.     Left Sinus: No maxillary sinus tenderness or frontal sinus tenderness.     Mouth/Throat:     Pharynx: Oropharynx is clear. Uvula midline. Posterior oropharyngeal erythema present. No oropharyngeal exudate.     Tonsils: No tonsillar abscesses.  Eyes:     General: No scleral icterus.    Conjunctiva/sclera: Conjunctivae normal.     Pupils: Pupils are equal, round, and reactive to light.  Neck:     Musculoskeletal: Normal range of motion and neck supple.  Cardiovascular:     Rate and Rhythm: Normal rate and regular rhythm.     Heart sounds: Normal heart sounds. No murmur.  Pulmonary:     Effort: Pulmonary effort is normal. No respiratory distress.     Breath sounds: Normal breath sounds. No wheezing or rales.  Lymphadenopathy:       Cervical: No cervical adenopathy.  Skin:    General: Skin is warm and dry.     Findings: No rash.  Neurological:     Mental Status: He is alert.       Assessment & Plan:   Problem List Items Addressed This Visit    Acute sinusitis - Primary    Anticipate viral given short duration. Supportive care reviewed as per instructions. Update if not improving with treatment. Red flags to update [place] to consider abx reviewed. Pt agrees with plan.       Relevant Medications   terbinafine (LAMISIL) 250 MG tablet       No orders of the defined types were placed in this encounter.  No orders of the defined types were placed in this encounter.  Patient Instructions  You have a sinus infection likely viral. Take flonase nasal steroid. Take ibuprofen 400-600mg  with meals for next 3-5 days.  May use plain mucinex with plenty of fluid to help mobilize mucous. Continue neti pot.  Push fluids and plenty of rest. Let us know if fever >101, worsening facial pain not improving, or ongoing symptoms past 7-10 days.    Follow up plan: Return if symptoms worsen or fail to improve.  Ria Bush, MD

## 2018-07-14 NOTE — Assessment & Plan Note (Addendum)
Anticipate viral given short duration. Supportive care reviewed as per instructions. Update if not improving with treatment. Red flags to update [place] to consider abx reviewed. Pt agrees with plan.

## 2018-07-17 ENCOUNTER — Other Ambulatory Visit: Payer: Self-pay | Admitting: Family Medicine

## 2018-08-11 DIAGNOSIS — Z79899 Other long term (current) drug therapy: Secondary | ICD-10-CM | POA: Diagnosis not present

## 2018-08-11 DIAGNOSIS — D239 Other benign neoplasm of skin, unspecified: Secondary | ICD-10-CM | POA: Diagnosis not present

## 2018-08-11 DIAGNOSIS — B351 Tinea unguium: Secondary | ICD-10-CM | POA: Diagnosis not present

## 2018-08-11 DIAGNOSIS — B359 Dermatophytosis, unspecified: Secondary | ICD-10-CM | POA: Diagnosis not present

## 2018-10-20 DIAGNOSIS — M3501 Sicca syndrome with keratoconjunctivitis: Secondary | ICD-10-CM | POA: Diagnosis not present

## 2018-12-05 ENCOUNTER — Telehealth: Payer: Self-pay | Admitting: Family Medicine

## 2018-12-05 DIAGNOSIS — J309 Allergic rhinitis, unspecified: Secondary | ICD-10-CM

## 2018-12-05 MED ORDER — TRIAMCINOLONE ACETONIDE 55 MCG/ACT NA AERO: 2.0000 | INHALATION_SPRAY | Freq: Every day | NASAL | 12 refills | Status: DC

## 2018-12-05 NOTE — Telephone Encounter (Signed)
Received refill request from [facility] on [address] for [facility] nasal allergy 24 hr spray (qyt 16.9 ml, sig: place 2 spray into the nose daily. We never send in this rx before, please advise.

## 2018-12-05 NOTE — Telephone Encounter (Signed)
Refilled

## 2018-12-06 ENCOUNTER — Encounter: Payer: Self-pay | Admitting: Family Medicine

## 2018-12-06 ENCOUNTER — Ambulatory Visit (INDEPENDENT_AMBULATORY_CARE_PROVIDER_SITE_OTHER): Payer: BC Managed Care – PPO | Admitting: Family Medicine

## 2018-12-06 VITALS — Temp 98.1°F | Ht 66.5 in | Wt 208.2 lb

## 2018-12-06 DIAGNOSIS — L237 Allergic contact dermatitis due to plants, except food: Secondary | ICD-10-CM

## 2018-12-06 DIAGNOSIS — E1165 Type 2 diabetes mellitus with hyperglycemia: Secondary | ICD-10-CM

## 2018-12-06 DIAGNOSIS — E118 Type 2 diabetes mellitus with unspecified complications: Secondary | ICD-10-CM | POA: Diagnosis not present

## 2018-12-06 DIAGNOSIS — IMO0002 Reserved for concepts with insufficient information to code with codable children: Secondary | ICD-10-CM

## 2018-12-06 MED ORDER — PREDNISONE 20 MG PO TABS: ORAL_TABLET | ORAL | 0 refills | Status: DC

## 2018-12-06 MED ORDER — TRIAMCINOLONE ACETONIDE 0.1 % EX CREA: 1.0000 "application " | TOPICAL_CREAM | Freq: Two times a day (BID) | CUTANEOUS | 0 refills | Status: DC

## 2018-12-06 NOTE — Progress Notes (Signed)
Virtual visit completed through [email or web address]. Due to national recommendations of social distancing due to COVID-19, a virtual visit is felt to be most appropriate for this patient at this time. Reviewed limitations of a virtual visit.   Patient location: home Provider location: [place] at [facility], office If any vitals were documented, they were collected by patient at home unless specified below.    Temp 98.1 F (36.7 C) (Oral)   Ht 5' 6.5" (1.689 m)   Wt 208 lb 4 oz (94.5 kg)   BMI 33.11 kg/m    CC: poison ivy Subjective:    Patient ID: Tim Hawkins, male    DOB: 03-02-1971, 48 y.o.   MRN: [id number]  HPI: Tim Hawkins is a 48 y.o. male presenting on 12/06/2018 for Rash (C/o poison oak rash on bilateral UEs and LEs. Started about 1 wk ago. Calamine lotion no longer helping. )   While cleaning out wooded area outdoors, got exposed to poison oak. Present for the past 1 + week. Initially L leg and R arm, now spreading to both legs and arms. Especially noted on L side. Treating with calomine lotion and ivy care topical without benefit.   DM - feels sugars have been well controlled. Compliant with metformin 500mg  BID.      Relevant past medical, surgical, family and social history reviewed and updated as indicated. Interim medical history since our last visit reviewed. Allergies and medications reviewed and updated. Outpatient Medications Prior to Visit  Medication Sig Dispense Refill  . ASPIRIN 81 PO Take 1 tablet by mouth daily.    Marland Kitchen CINNAMON PO Take 1 capsule by mouth daily.    Marland Kitchen glucose blood test strip 1 each by Other route daily. Use to check sugar once daily Dx: E11.9 **One Touch Ultra Mini Blue** 100 each 3  . ketoconazole (NIZORAL) 2 % cream Apply 1 application topically 2 (two) times daily.    . metFORMIN (GLUCOPHAGE) 500 MG tablet TAKE 1 TABLET TWICE A DAY WITH MEALS 180 tablet 1  . Multiple Vitamin (ONE-A-DAY MENS PO) Take 1 tablet by mouth daily.    . Omega-3  Fatty Acids (FISH OIL PO) Take 1 capsule by mouth daily.    Marland Kitchen terbinafine (LAMISIL) 250 MG tablet Take 1 tablet by mouth daily. Takes daily with food    . triamcinolone (NASACORT) 55 MCG/ACT AERO nasal inhaler Place 2 sprays into the nose daily. 1 Inhaler 12  . Wheat Dextrin (BENEFIBER) POWD Take by mouth 2 (two) times daily.     No facility-administered medications prior to visit.      Per HPI unless specifically indicated in ROS section below Review of Systems Objective:    Temp 98.1 F (36.7 C) (Oral)   Ht 5' 6.5" (1.689 m)   Wt 208 lb 4 oz (94.5 kg)   BMI 33.11 kg/m   Wt Readings from Last 3 Encounters:  12/06/18 208 lb 4 oz (94.5 kg)  07/14/18 212 lb 8 oz (96.4 kg)  04/04/18 209 lb 4 oz (94.9 kg)     Physical exam: Gen: alert, NAD, not ill appearing Pulm: speaks in complete sentences without increased work of breathing Psych: normal mood, normal thought content  Skin: erythematous linear rash noticed throughout anterior lower L leg, some excoriations.     Results for orders placed or performed in visit on 04/04/18  [facility] DIABETES EYE EXAM  Result Value Ref Range   HM Diabetic Eye Exam No Retinopathy No Retinopathy   Lab Results  Component Value Date   HGBA1C 7.4 (H) 03/28/2018    Assessment & Plan:   Problem List Items Addressed This Visit    Poison ivy dermatitis - Primary    Story/exam via video call consistent with poison ivy dermatitis. Treat with triamcinolone cream topically as well as short prednisone taper. Reviewed caution with oral steroids and increased sugars, to limit carbs while on prednisone course. Update if not improving with treatment. Pt agrees with plan.       Diabetes mellitus type 2, uncontrolled, with complications ([place])       Meds ordered this encounter  Medications  . predniSONE (DELTASONE) 20 MG tablet    Sig: Take two tablets daily for 3 days followed by one tablet daily for 4 days    Dispense:  10 tablet    Refill:  0  .  triamcinolone cream (KENALOG) 0.1 %    Sig: Apply 1 application topically 2 (two) times daily. Apply to AA.    Dispense:  45 g    Refill:  0   No orders of the defined types were placed in this encounter.   I discussed the assessment and treatment plan with the patient. The patient was provided an opportunity to ask questions and all were answered. The patient agreed with the plan and demonstrated an understanding of the instructions. The patient was advised to call back or seek an in-person evaluation if the symptoms worsen or if the condition fails to improve as anticipated.  Follow up plan: Return if symptoms worsen or fail to improve.  Ria Bush, MD

## 2018-12-06 NOTE — Assessment & Plan Note (Signed)
Story/exam via video call consistent with poison ivy dermatitis. Treat with triamcinolone cream topically as well as short prednisone taper. Reviewed caution with oral steroids and increased sugars, to limit carbs while on prednisone course. Update if not improving with treatment. Pt agrees with plan.

## 2019-01-03 ENCOUNTER — Other Ambulatory Visit: Payer: Self-pay | Admitting: Family Medicine

## 2019-01-31 ENCOUNTER — Telehealth: Payer: Self-pay | Admitting: Family Medicine

## 2019-01-31 DIAGNOSIS — IMO0002 Reserved for concepts with insufficient information to code with codable children: Secondary | ICD-10-CM

## 2019-01-31 DIAGNOSIS — E1165 Type 2 diabetes mellitus with hyperglycemia: Secondary | ICD-10-CM

## 2019-01-31 NOTE — Telephone Encounter (Signed)
Patient scheduled lab appointment on Monday 8/10 @ 8:15 to have his A1C checked. Can you order this?

## 2019-02-01 NOTE — Telephone Encounter (Signed)
I imagine he just wanted POC A1c - ordered.

## 2019-02-06 ENCOUNTER — Other Ambulatory Visit: Payer: Self-pay | Admitting: Family Medicine

## 2019-02-06 ENCOUNTER — Other Ambulatory Visit: Payer: BC Managed Care – PPO

## 2019-02-06 ENCOUNTER — Other Ambulatory Visit: Payer: Self-pay

## 2019-02-06 DIAGNOSIS — IMO0002 Reserved for concepts with insufficient information to code with codable children: Secondary | ICD-10-CM

## 2019-02-06 DIAGNOSIS — E1165 Type 2 diabetes mellitus with hyperglycemia: Secondary | ICD-10-CM

## 2019-02-06 LAB — POCT GLYCOSYLATED HEMOGLOBIN (HGB A1C): Hemoglobin A1C: 7.8 % — AB (ref 4.0–5.6)

## 2019-02-06 MED ORDER — METFORMIN HCL 500 MG PO TABS: ORAL_TABLET | ORAL | 1 refills | Status: DC

## 2019-03-24 ENCOUNTER — Other Ambulatory Visit: Payer: Self-pay

## 2019-03-24 MED ORDER — METFORMIN HCL 500 MG PO TABS: ORAL_TABLET | ORAL | 1 refills | Status: DC

## 2019-03-24 NOTE — Telephone Encounter (Signed)
Patient needs refill on Metformin sent to [facility]. He is not able to use local pharmacy for long term medications with his insurance. RX re sent to mail order.

## 2019-04-04 ENCOUNTER — Ambulatory Visit (INDEPENDENT_AMBULATORY_CARE_PROVIDER_SITE_OTHER): Payer: BC Managed Care – PPO

## 2019-04-04 DIAGNOSIS — Z23 Encounter for immunization: Secondary | ICD-10-CM

## 2019-06-07 ENCOUNTER — Encounter: Payer: Self-pay | Admitting: Family Medicine

## 2019-06-07 ENCOUNTER — Other Ambulatory Visit: Payer: Self-pay

## 2019-06-07 ENCOUNTER — Ambulatory Visit (INDEPENDENT_AMBULATORY_CARE_PROVIDER_SITE_OTHER): Payer: BC Managed Care – PPO | Admitting: Family Medicine

## 2019-06-07 VITALS — Temp 98.6°F | Ht 66.5 in

## 2019-06-07 DIAGNOSIS — J320 Chronic maxillary sinusitis: Secondary | ICD-10-CM | POA: Diagnosis not present

## 2019-06-07 MED ORDER — AMOXICILLIN-POT CLAVULANATE 875-125 MG PO TABS: 1.0000 | ORAL_TABLET | Freq: Two times a day (BID) | ORAL | 0 refills | Status: AC

## 2019-06-07 NOTE — Progress Notes (Signed)
     Chimamanda Siegfried T. Arliene Rosenow, MD Primary Care and Sports Medicine [facility] at [facility] [place], [address] Phone: [phone]  FAX: [phone]  Tim Hawkins - 48 y.o. male  MRN [id number]  Date of Birth: 28-Oct-1970  Visit Date: 06/07/2019  PCP: Ria Bush, MD  Referred by: Ria Bush, MD Chief Complaint  Patient presents with  . Nasal Congestion  . Facial Pain  . Facial Swelling   Virtual Visit via Video Note:  I connected with  [name] on 06/07/2019 10:20 AM EST by a video enabled telemedicine application and verified that I am speaking with the correct person using two identifiers.   Location patient: home computer, tablet, or smartphone Location provider: work or home office Consent: Verbal consent directly obtained from [employer]. Persons participating in the virtual visit: patient, provider  I discussed the limitations of evaluation and management by telemedicine and the availability of in person appointments. The patient expressed understanding and agreed to proceed.  History of Present Illness:  He is a real pleasant guy 48 years old.  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  He is not bending any group settings or anywhere where he is specifically been around anybody with potentially COVID-19.  He has left-sided maxillary pain and some congestion in his sinuses.  He denies other complaints including sore throat, earache, chest congestion, coughing up of sputum, nausea, vomiting, diarrhea.  Review of Systems as above: See pertinent positives and pertinent negatives per HPI No acute distress verbally  Past Medical History, Surgical History, Social History, Family History, Problem List, Medications, and Allergies have been reviewed and updated if relevant.   Observations/Objective/Exam:  An attempt was made to discern  vital signs over the phone and per patient if applicable and possible.   General:    Alert, Oriented, appears well and in no acute distress HEENT:     Atraumatic, conjunctiva clear, no obvious abnormalities on inspection of external nose and ears.  Neck:    Normal movements of the head and neck Pulmonary:     On inspection no signs of respiratory distress, breathing rate appears normal, no obvious gross SOB, gasping or wheezing Cardiovascular:    No obvious cyanosis Musculoskeletal:    Moves all visible extremities without noticeable abnormality Psych / Neurological:     Pleasant and cooperative, no obvious depression or anxiety, speech and thought processing grossly intact  Assessment and Plan:    ICD-10-CM   1. Left maxillary sinusitis  J32.0    We will treat as such.  I discussed the assessment and treatment plan with the patient. The patient was provided an opportunity to ask questions and all were answered. The patient agreed with the plan and demonstrated an understanding of the instructions.   The patient was advised to call back or seek an in-person evaluation if the symptoms worsen or if the condition fails to improve as anticipated.  Follow-up: prn unless noted otherwise below No follow-ups on file.  Meds ordered this encounter  Medications  . amoxicillin-clavulanate (AUGMENTIN) 875-125 MG tablet    Sig: Take 1 tablet by mouth 2 (two) times daily for 10 days.    Dispense:  20 tablet    Refill:  0   No orders of the defined types were placed in this encounter.   Signed,  Maud Deed. Talicia Sui, MD

## 2019-06-24 DIAGNOSIS — Z20828 Contact with and (suspected) exposure to other viral communicable diseases: Secondary | ICD-10-CM | POA: Diagnosis not present

## 2019-07-01 DIAGNOSIS — Z20828 Contact with and (suspected) exposure to other viral communicable diseases: Secondary | ICD-10-CM | POA: Diagnosis not present

## 2019-09-23 ENCOUNTER — Other Ambulatory Visit: Payer: Self-pay | Admitting: Family Medicine

## 2019-11-14 ENCOUNTER — Other Ambulatory Visit: Payer: Self-pay | Admitting: Dermatology

## 2019-12-15 ENCOUNTER — Other Ambulatory Visit: Payer: Self-pay | Admitting: Family Medicine

## 2020-01-05 ENCOUNTER — Other Ambulatory Visit: Payer: Self-pay | Admitting: Family Medicine

## 2020-01-05 DIAGNOSIS — J309 Allergic rhinitis, unspecified: Secondary | ICD-10-CM

## 2020-01-05 NOTE — Telephone Encounter (Signed)
E-scribed refill.  Plz schedule cpe and lab visits.  

## 2020-01-25 ENCOUNTER — Other Ambulatory Visit: Payer: Self-pay | Admitting: Family Medicine

## 2020-01-25 DIAGNOSIS — E785 Hyperlipidemia, unspecified: Secondary | ICD-10-CM

## 2020-01-25 DIAGNOSIS — Z1159 Encounter for screening for other viral diseases: Secondary | ICD-10-CM

## 2020-01-25 DIAGNOSIS — E1165 Type 2 diabetes mellitus with hyperglycemia: Secondary | ICD-10-CM

## 2020-01-25 DIAGNOSIS — Z125 Encounter for screening for malignant neoplasm of prostate: Secondary | ICD-10-CM

## 2020-01-25 DIAGNOSIS — IMO0002 Reserved for concepts with insufficient information to code with codable children: Secondary | ICD-10-CM

## 2020-01-26 ENCOUNTER — Other Ambulatory Visit (INDEPENDENT_AMBULATORY_CARE_PROVIDER_SITE_OTHER): Payer: BC Managed Care – PPO

## 2020-01-26 ENCOUNTER — Other Ambulatory Visit: Payer: Self-pay

## 2020-01-26 DIAGNOSIS — E118 Type 2 diabetes mellitus with unspecified complications: Secondary | ICD-10-CM | POA: Diagnosis not present

## 2020-01-26 DIAGNOSIS — Z1159 Encounter for screening for other viral diseases: Secondary | ICD-10-CM | POA: Diagnosis not present

## 2020-01-26 DIAGNOSIS — IMO0002 Reserved for concepts with insufficient information to code with codable children: Secondary | ICD-10-CM

## 2020-01-26 DIAGNOSIS — E785 Hyperlipidemia, unspecified: Secondary | ICD-10-CM | POA: Diagnosis not present

## 2020-01-26 DIAGNOSIS — E1165 Type 2 diabetes mellitus with hyperglycemia: Secondary | ICD-10-CM

## 2020-01-26 DIAGNOSIS — Z125 Encounter for screening for malignant neoplasm of prostate: Secondary | ICD-10-CM

## 2020-01-26 LAB — COMPREHENSIVE METABOLIC PANEL
ALT: 28 U/L (ref 0–53)
AST: 19 U/L (ref 0–37)
Albumin: 4.3 g/dL (ref 3.5–5.2)
Alkaline Phosphatase: 77 U/L (ref 39–117)
BUN: 14 mg/dL (ref 6–23)
CO2: 29 mEq/L (ref 19–32)
Calcium: 9.5 mg/dL (ref 8.4–10.5)
Chloride: 101 mEq/L (ref 96–112)
Creatinine, Ser: 0.48 mg/dL (ref 0.40–1.50)
GFR: 183.8 mL/min (ref >60.00)
Glucose, Bld: 166 mg/dL — ABNORMAL HIGH (ref 70–99)
Potassium: 4.7 mEq/L (ref 3.5–5.1)
Sodium: 136 mEq/L (ref 135–145)
Total Bilirubin: 0.5 mg/dL (ref 0.2–1.2)
Total Protein: 7 g/dL (ref 6.0–8.3)

## 2020-01-26 LAB — LIPID PANEL
Cholesterol: 180 mg/dL (ref 0–200)
HDL: 31.2 mg/dL — ABNORMAL LOW (ref >39.00)
NonHDL: 148.65
Total CHOL/HDL Ratio: 6
Triglycerides: 205 mg/dL — ABNORMAL HIGH (ref 0.0–149.0)
VLDL: 41 mg/dL — ABNORMAL HIGH (ref 0.0–40.0)

## 2020-01-26 LAB — PSA: PSA: 0.71 ng/mL (ref 0.10–4.00)

## 2020-01-26 LAB — LDL CHOLESTEROL, DIRECT: Direct LDL: 116 mg/dL

## 2020-01-26 LAB — HEMOGLOBIN A1C: Hgb A1c MFr Bld: 7.9 % — ABNORMAL HIGH (ref 4.6–6.5)

## 2020-01-26 LAB — MICROALBUMIN / CREATININE URINE RATIO
Creatinine,U: 50.4 mg/dL
Microalb Creat Ratio: 1.4 mg/g (ref 0.0–30.0)
Microalb, Ur: <0.7 mg/dL (ref 0.0–1.9)

## 2020-01-29 LAB — HEPATITIS C ANTIBODY
Hepatitis C Ab: NONREACTIVE
SIGNAL TO CUT-OFF: 0.07 (ref <1.00)

## 2020-02-02 ENCOUNTER — Other Ambulatory Visit: Payer: Self-pay | Admitting: Family Medicine

## 2020-02-02 DIAGNOSIS — J309 Allergic rhinitis, unspecified: Secondary | ICD-10-CM

## 2020-02-07 ENCOUNTER — Encounter: Payer: BC Managed Care – PPO | Admitting: Family Medicine

## 2020-03-11 ENCOUNTER — Ambulatory Visit: Payer: BC Managed Care – PPO | Admitting: Family Medicine

## 2020-04-15 ENCOUNTER — Encounter: Payer: Self-pay | Admitting: Family Medicine

## 2020-04-15 ENCOUNTER — Other Ambulatory Visit: Payer: Self-pay

## 2020-04-15 ENCOUNTER — Ambulatory Visit: Payer: BC Managed Care – PPO | Admitting: Family Medicine

## 2020-04-15 VITALS — BP 128/66 | HR 86 | Temp 97.4°F | Ht 66.5 in | Wt 187.1 lb

## 2020-04-15 DIAGNOSIS — E118 Type 2 diabetes mellitus with unspecified complications: Secondary | ICD-10-CM

## 2020-04-15 DIAGNOSIS — Z0000 Encounter for general adult medical examination without abnormal findings: Secondary | ICD-10-CM

## 2020-04-15 DIAGNOSIS — E1169 Type 2 diabetes mellitus with other specified complication: Secondary | ICD-10-CM

## 2020-04-15 DIAGNOSIS — IMO0002 Reserved for concepts with insufficient information to code with codable children: Secondary | ICD-10-CM

## 2020-04-15 DIAGNOSIS — E1165 Type 2 diabetes mellitus with hyperglycemia: Secondary | ICD-10-CM

## 2020-04-15 DIAGNOSIS — Z23 Encounter for immunization: Secondary | ICD-10-CM | POA: Diagnosis not present

## 2020-04-15 DIAGNOSIS — E669 Obesity, unspecified: Secondary | ICD-10-CM

## 2020-04-15 DIAGNOSIS — E785 Hyperlipidemia, unspecified: Secondary | ICD-10-CM

## 2020-04-15 DIAGNOSIS — Z1211 Encounter for screening for malignant neoplasm of colon: Secondary | ICD-10-CM

## 2020-04-15 MED ORDER — GLUCOSE BLOOD VI STRP: 1.0000 | ORAL_STRIP | Freq: Every day | 3 refills | Status: AC

## 2020-04-15 MED ORDER — METFORMIN HCL 500 MG PO TABS
500.0000 mg | ORAL_TABLET | Freq: Two times a day (BID) | ORAL | 3 refills | Status: DC
Start: 2020-04-15 — End: 2020-06-11

## 2020-04-15 NOTE — Assessment & Plan Note (Signed)
Chronic. Anticipate good control based on recall cbg's. Continue current regimen.

## 2020-04-15 NOTE — Assessment & Plan Note (Signed)
Chronic, uncontrolled in diabetic. Discussed diet choices to improve LDL control. Reassess at f/u labs in 2 months, if elevated would recommend starting statin.  The 10-year ASCVD risk score Mikey Bussing DC Brooke Bonito., et al., 2013) is: 10.1%   Values used to calculate the score:     Age: 49 years     Sex: Male     Is Non-Hispanic African American: No     Diabetic: Yes     Tobacco smoker: No     Systolic Blood Pressure: 875 mmHg     Is BP treated: No     HDL Cholesterol: 31.2 mg/dL     Total Cholesterol: 180 mg/dL

## 2020-04-15 NOTE — Progress Notes (Signed)
This visit was conducted in person.  BP 128/66 (BP Location: Left Arm, Patient Position: Sitting)   Pulse 86   Temp (!) 97.4 F (36.3 C)   Ht 5' 6.5" (1.689 m)   Wt 187 lb 1.6 oz (84.9 kg)   SpO2 97%   BMI 29.75 kg/m    CC: CPE Subjective:    Patient ID: Tim Hawkins, male    DOB: 03-02-1971, 49 y.o.   MRN: [id number]  HPI: Tim Hawkins is a 49 y.o. male presenting on 04/15/2020 for Annual Exam   Weight loss, sugars have stabilized and he's been able to come off several meds (cut down on metformin to 500mg  bid). Great sugar control <120 since healthy changes. Fully off carbs and sugar, more water. 30 lb weight loss with this. Wants to recheck sugars in December. Walking 30 min/day, biking regularly as well - enjoys going camping in his RV.   Preventative: Colon cancer screen - discussed, would like colonoscopy.  Prostate cancer screen - no fmhx prostate cancer. No prostate symptoms - no nocturia.  Lung cancer screen - not eligible Flu shot yearly Pneumovax 2016 Tdap 2016 [place] completed 08/2019, 09/2019  shingrix - discussed.  Seat belt use discussed Sunscreen use discussed, no changing moles on skin. Ex smoker - quit 2014. Previous cigar use.  Alcohol - rare  Dentist Q6 mo  Eye exam yearly  Lives withwifeLori.No pets  Occupation: truck Geophysicist/field seismologist  Activity: tries to walk regularly 30 min/day Diet: good water, tries daily fruits/vegetables     Relevant past medical, surgical, family and social history reviewed and updated as indicated. Interim medical history since our last visit reviewed. Allergies and medications reviewed and updated. Outpatient Medications Prior to Visit  Medication Sig Dispense Refill  . ASPIRIN 81 PO Take 1 tablet by mouth daily.    Marland Kitchen CINNAMON PO Take 1 capsule by mouth daily.    Marland Kitchen ketoconazole (NIZORAL) 2 % cream Apply topically 2 (two) times daily. Bid to hands and feet 60 g 0  . Multiple Vitamin (ONE-A-DAY MENS PO)  Take 1 tablet by mouth daily.    . Omega-3 Fatty Acids (FISH OIL PO) Take 1 capsule by mouth daily.    . Probiotic Product (PROBIOTIC PO) Take 1 tablet by mouth daily.    Marland Kitchen triamcinolone (NASACORT) 55 MCG/ACT AERO nasal inhaler PLACE 2 SPRAYS INTO THE NOSE DAILY. 1 each 1  . glucose blood test strip 1 each by Other route daily. Use to check sugar once daily Dx: E11.9 **One Touch Ultra Mini Blue** 100 each 3  . metFORMIN (GLUCOPHAGE) 500 MG tablet TAKE 2 TABLETS IN THE MORNING AND TAKE 1 TABLET IN THE EVENING 270 tablet 3   No facility-administered medications prior to visit.     Per HPI unless specifically indicated in ROS section below Review of Systems  Constitutional: Negative for activity change, appetite change, chills, fatigue, fever and unexpected weight change.  HENT: Negative for hearing loss.   Eyes: Negative for visual disturbance.  Respiratory: Negative for cough, chest tightness, shortness of breath and wheezing.   Cardiovascular: Negative for chest pain, palpitations and leg swelling.  Gastrointestinal: Negative for abdominal distention, abdominal pain, blood in stool, constipation, diarrhea, nausea and vomiting.  Genitourinary: Negative for difficulty urinating and hematuria.  Musculoskeletal: Negative for arthralgias, myalgias and neck pain.  Skin: Negative for rash.  Neurological: Negative for dizziness, seizures, syncope and headaches.  Hematological: Negative for adenopathy. Does not bruise/bleed easily.  Psychiatric/Behavioral: Negative  for dysphoric mood. The patient is not nervous/anxious.    Objective:  BP 128/66 (BP Location: Left Arm, Patient Position: Sitting)   Pulse 86   Temp (!) 97.4 F (36.3 C)   Ht 5' 6.5" (1.689 m)   Wt 187 lb 1.6 oz (84.9 kg)   SpO2 97%   BMI 29.75 kg/m   Wt Readings from Last 3 Encounters:  04/15/20 187 lb 1.6 oz (84.9 kg)  12/06/18 208 lb 4 oz (94.5 kg)  07/14/18 212 lb 8 oz (96.4 kg)      Physical Exam Vitals and nursing  note reviewed.  Constitutional:      General: He is not in acute distress.    Appearance: Normal appearance. He is well-developed. He is not ill-appearing.  HENT:     Head: Normocephalic and atraumatic.     Right Ear: Hearing, tympanic membrane, ear canal and external ear normal.     Left Ear: Hearing, tympanic membrane, ear canal and external ear normal.     Mouth/Throat:     Pharynx: Uvula midline.  Eyes:     General: No scleral icterus.    Extraocular Movements: Extraocular movements intact.     Conjunctiva/sclera: Conjunctivae normal.     Pupils: Pupils are equal, round, and reactive to light.  Cardiovascular:     Rate and Rhythm: Normal rate and regular rhythm.     Pulses: Normal pulses.          Radial pulses are 2+ on the right side and 2+ on the left side.     Heart sounds: Normal heart sounds. No murmur heard.   Pulmonary:     Effort: Pulmonary effort is normal. No respiratory distress.     Breath sounds: Normal breath sounds. No wheezing, rhonchi or rales.  Abdominal:     General: Abdomen is flat. Bowel sounds are normal. There is no distension.     Palpations: Abdomen is soft. There is no mass.     Tenderness: There is no abdominal tenderness. There is no guarding or rebound.     Hernia: No hernia is present.  Musculoskeletal:        General: Normal range of motion.     Cervical back: Normal range of motion and neck supple.     Right lower leg: No edema.     Left lower leg: No edema.  Lymphadenopathy:     Cervical: No cervical adenopathy.  Skin:    General: Skin is warm and dry.     Findings: No rash.  Neurological:     General: No focal deficit present.     Mental Status: He is alert and oriented to person, place, and time.     Comments: CN grossly intact, station and gait intact  Psychiatric:        Mood and Affect: Mood normal.        Behavior: Behavior normal.        Thought Content: Thought content normal.        Judgment: Judgment normal.         Results for orders placed or performed in visit on 01/26/20  PSA  Result Value Ref Range   PSA 0.71 0.10 - 4.00 ng/mL  Hepatitis C antibody  Result Value Ref Range   Hepatitis C Ab NON-REACTIVE NON-REACTI   SIGNAL TO CUT-OFF 0.07 <1.00  Microalbumin / creatinine urine ratio  Result Value Ref Range   Microalb, Ur <0.7 0.0 - 1.9 mg/dL   Creatinine,U 50.4 mg/dL  Microalb Creat Ratio 1.4 0.0 - 30.0 mg/g  Hemoglobin A1c  Result Value Ref Range   Hgb A1c MFr Bld 7.9 (H) 4.6 - 6.5 %  Comprehensive metabolic panel  Result Value Ref Range   Sodium 136 135 - 145 mEq/L   Potassium 4.7 3.5 - 5.1 mEq/L   Chloride 101 96 - 112 mEq/L   CO2 29 19 - 32 mEq/L   Glucose, Bld 166 (H) 70 - 99 mg/dL   BUN 14 6 - 23 mg/dL   Creatinine, Ser 0.48 0.40 - 1.50 mg/dL   Total Bilirubin 0.5 0.2 - 1.2 mg/dL   Alkaline Phosphatase 77 39 - 117 U/L   AST 19 0 - 37 U/L   ALT 28 0 - 53 U/L   Total Protein 7.0 6.0 - 8.3 g/dL   Albumin 4.3 3.5 - 5.2 g/dL   GFR 183.80 >60.00 mL/min   Calcium 9.5 8.4 - 10.5 mg/dL  Lipid panel  Result Value Ref Range   Cholesterol 180 0 - 200 mg/dL   Triglycerides 205.0 (H) 0 - 149 mg/dL   HDL 31.20 (L) >39.00 mg/dL   VLDL 41.0 (H) 0.0 - 40.0 mg/dL   Total CHOL/HDL Ratio 6    NonHDL 148.65   LDL cholesterol, direct  Result Value Ref Range   Direct LDL 116.0 mg/dL   Assessment & Plan:  This visit occurred during the SARS-CoV-2 public health emergency.  Safety protocols were in place, including screening questions prior to the visit, additional usage of staff PPE, and extensive cleaning of exam room while observing appropriate contact time as indicated for disinfecting solutions.   Problem List Items Addressed This Visit    Obesity, Class I, BMI 30-34.9    Congratulated on healthy changes to date leading to 30 lb weight loss since last seen here! He is motivated to continue healthy changes (regular activity, following low sugar low carb diabetic diet).       Health  maintenance examination - Primary    Preventative protocols reviewed and updated unless pt declined. Discussed healthy diet and lifestyle.       Dyslipidemia associated with type 2 diabetes mellitus (HCC)    Chronic, uncontrolled in diabetic. Discussed diet choices to improve LDL control. Reassess at f/u labs in 2 months, if elevated would recommend starting statin.  The 10-year ASCVD risk score Mikey Bussing DC Brooke Bonito., et al., 2013) is: 10.1%   Values used to calculate the score:     Age: 48 years     Sex: Male     Is Non-Hispanic African American: No     Diabetic: Yes     Tobacco smoker: No     Systolic Blood Pressure: 833 mmHg     Is BP treated: No     HDL Cholesterol: 31.2 mg/dL     Total Cholesterol: 180 mg/dL       Relevant Medications   metFORMIN (GLUCOPHAGE) 500 MG tablet   Diabetes mellitus type 2, uncontrolled, with complications (HCC)    Chronic. Anticipate good control based on recall cbg's. Continue current regimen.       Relevant Medications   metFORMIN (GLUCOPHAGE) 500 MG tablet    Other Visit Diagnoses    Special screening for malignant neoplasms, colon       Relevant Orders   Ambulatory referral to Gastroenterology   Need for immunization against influenza       Relevant Orders   Flu Vaccine QUAD 36+ mos IM (Completed)  Meds ordered this encounter  Medications  . glucose blood test strip    Sig: 1 each by Other route daily. Use to check sugar once daily Dx: E11.9 **One Touch Ultra Mini Blue**    Dispense:  100 each    Refill:  3  . metFORMIN (GLUCOPHAGE) 500 MG tablet    Sig: Take 1 tablet (500 mg total) by mouth 2 (two) times daily with a meal.    Dispense:  180 tablet    Refill:  3   Orders Placed This Encounter  Procedures  . Flu Vaccine QUAD 36+ mos IM  . Ambulatory referral to Gastroenterology    Referral Priority:   Routine    Referral Type:   Consultation    Referral Reason:   Specialty Services Required    Number of Visits Requested:   1     Patient instructions: Flu shot today  You are doing great today! Return in 2 months for labs only (sugar and cholesterol) Return in 6 months for diabetes follow up visit.   Follow up plan: Return for follow up visit.  Ria Bush, MD

## 2020-04-15 NOTE — Patient Instructions (Addendum)
Flu shot today  You are doing great today! Return in 2 months for labs only (sugar and cholesterol) Return in 6 months for diabetes follow up visit.   Health Maintenance, Male Adopting a healthy lifestyle and getting preventive care are important in promoting health and wellness. Ask your health care provider about:  The right schedule for you to have regular tests and exams.  Things you can do on your own to prevent diseases and keep yourself healthy. What should I know about diet, weight, and exercise? Eat a healthy diet   Eat a diet that includes plenty of vegetables, fruits, low-fat dairy products, and lean protein.  Do not eat a lot of foods that are high in solid fats, added sugars, or sodium. Maintain a healthy weight Body mass index (BMI) is a measurement that can be used to identify possible weight problems. It estimates body fat based on height and weight. Your health care provider can help determine your BMI and help you achieve or maintain a healthy weight. Get regular exercise Get regular exercise. This is one of the most important things you can do for your health. Most adults should:  Exercise for at least 150 minutes each week. The exercise should increase your heart rate and make you sweat (moderate-intensity exercise).  Do strengthening exercises at least twice a week. This is in addition to the moderate-intensity exercise.  Spend less time sitting. Even light physical activity can be beneficial. Watch cholesterol and blood lipids Have your blood tested for lipids and cholesterol at 51 years of age, then have this test every 5 years. You may need to have your cholesterol levels checked more often if:  Your lipid or cholesterol levels are high.  You are older than 51 years of age.  You are at high risk for heart disease. What should I know about cancer screening? Many types of cancers can be detected early and may often be prevented. Depending on your health  history and family history, you may need to have cancer screening at various ages. This may include screening for:  Colorectal cancer.  Prostate cancer.  Skin cancer.  Lung cancer. What should I know about heart disease, diabetes, and high blood pressure? Blood pressure and heart disease  High blood pressure causes heart disease and increases the risk of stroke. This is more likely to develop in people who have high blood pressure readings, are of African descent, or are overweight.  Talk with your health care provider about your target blood pressure readings.  Have your blood pressure checked: ? Every 3-5 years if you are 64-63 years of age. ? Every year if you are 34 years old or older.  If you are between the ages of 31 and 23 and are a current or former smoker, ask your health care provider if you should have a one-time screening for abdominal aortic aneurysm (AAA). Diabetes Have regular diabetes screenings. This checks your fasting blood sugar level. Have the screening done:  Once every three years after age 21 if you are at a normal weight and have a low risk for diabetes.  More often and at a younger age if you are overweight or have a high risk for diabetes. What should I know about preventing infection? Hepatitis B If you have a higher risk for hepatitis B, you should be screened for this virus. Talk with your health care provider to find out if you are at risk for hepatitis B infection. Hepatitis C Blood testing  is recommended for:  Everyone born from [birth year] through 1967.  Anyone with known risk factors for hepatitis C. Sexually transmitted infections (STIs)  You should be screened each year for STIs, including gonorrhea and chlamydia, if: ? You are sexually active and are younger than 51 years of age. ? You are older than 51 years of age and your health care provider tells you that you are at risk for this type of infection. ? Your sexual activity has changed since  you were last screened, and you are at increased risk for chlamydia or gonorrhea. Ask your health care provider if you are at risk.  Ask your health care provider about whether you are at high risk for HIV. Your health care provider may recommend a prescription medicine to help prevent HIV infection. If you choose to take medicine to prevent HIV, you should first get tested for HIV. You should then be tested every 3 months for as long as you are taking the medicine. Follow these instructions at home: Lifestyle  Do not use any products that contain nicotine or tobacco, such as cigarettes, e-cigarettes, and chewing tobacco. If you need help quitting, ask your health care provider.  Do not use street drugs.  Do not share needles.  Ask your health care provider for help if you need support or information about quitting drugs. Alcohol use  Do not drink alcohol if your health care provider tells you not to drink.  If you drink alcohol: ? Limit how much you have to 0-2 drinks a day. ? Be aware of how much alcohol is in your drink. In the [place] one drink equals one 12 oz bottle of beer (355 mL), one 5 oz glass of wine (148 mL), or one 1 oz glass of hard liquor (44 mL). General instructions  Schedule regular health, dental, and eye exams.  Stay current with your vaccines.  Tell your health care provider if: ? You often feel depressed. ? You have ever been abused or do not feel safe at home. Summary  Adopting a healthy lifestyle and getting preventive care are important in promoting health and wellness.  Follow your health care provider's instructions about healthy diet, exercising, and getting tested or screened for diseases.  Follow your health care provider's instructions on monitoring your cholesterol and blood pressure. This information is not intended to replace advice given to you by your health care provider. Make sure you discuss any questions you have with your health care  provider. Document Revised: 06/08/2018 Document Reviewed: 06/08/2018 Elsevier Patient Education  2020 [employer].

## 2020-04-15 NOTE — Assessment & Plan Note (Signed)
Preventative protocols reviewed and updated unless pt declined. Discussed healthy diet and lifestyle.  

## 2020-04-15 NOTE — Assessment & Plan Note (Signed)
Congratulated on healthy changes to date leading to 30 lb weight loss since last seen here! He is motivated to continue healthy changes (regular activity, following low sugar low carb diabetic diet).

## 2020-05-13 ENCOUNTER — Telehealth (INDEPENDENT_AMBULATORY_CARE_PROVIDER_SITE_OTHER): Payer: Self-pay | Admitting: Gastroenterology

## 2020-05-13 ENCOUNTER — Other Ambulatory Visit: Payer: Self-pay

## 2020-05-13 DIAGNOSIS — Z1211 Encounter for screening for malignant neoplasm of colon: Secondary | ICD-10-CM

## 2020-05-13 MED ORDER — NA SULFATE-K SULFATE-MG SULF 17.5-3.13-1.6 GM/177ML PO SOLN: 1.0000 | Freq: Once | ORAL | 0 refills | Status: AC

## 2020-05-13 NOTE — Progress Notes (Signed)
Gastroenterology Pre-Procedure Review  Request Date: Tuesday 05/28/20 Requesting Physician: Dr. Vicente Males  PATIENT REVIEW QUESTIONS: The patient responded to the following health history questions as indicated:    1. Are you having any GI issues? no 2. Do you have a personal history of Polyps? no 3. Do you have a family history of Colon Cancer or Polyps? No 4. Diabetes Mellitus? YES 5. Joint replacements in the past 12 months?no 6. Major health problems in the past 3 months?no 7. Any artificial heart valves, MVP, or defibrillator?no    MEDICATIONS & ALLERGIES:    Patient reports the following regarding taking any anticoagulation/antiplatelet therapy:   Plavix, Coumadin, Eliquis, Xarelto, Lovenox, Pradaxa, Brilinta, or Effient? no Aspirin? no  Patient confirms/reports the following medications:  Current Outpatient Medications  Medication Sig Dispense Refill  . ASPIRIN 81 PO Take 1 tablet by mouth daily.    Marland Kitchen CINNAMON PO Take 1 capsule by mouth daily.    Marland Kitchen glucose blood test strip 1 each by Other route daily. Use to check sugar once daily Dx: E11.9 **One Touch Ultra Mini Blue** 100 each 3  . ketoconazole (NIZORAL) 2 % cream Apply topically 2 (two) times daily. Bid to hands and feet 60 g 0  . metFORMIN (GLUCOPHAGE) 500 MG tablet Take 1 tablet (500 mg total) by mouth 2 (two) times daily with a meal. 180 tablet 3  . Multiple Vitamin (ONE-A-DAY MENS PO) Take 1 tablet by mouth daily.    . Omega-3 Fatty Acids (FISH OIL PO) Take 1 capsule by mouth daily.    . Probiotic Product (PROBIOTIC PO) Take 1 tablet by mouth daily.    Marland Kitchen triamcinolone (NASACORT) 55 MCG/ACT AERO nasal inhaler PLACE 2 SPRAYS INTO THE NOSE DAILY. 1 each 1  . Na Sulfate-K Sulfate-Mg Sulf 17.5-3.13-1.6 GM/177ML SOLN Take 1 kit by mouth once for 1 dose. 354 mL 0   No current facility-administered medications for this visit.    Patient confirms/reports the following allergies:  No Known Allergies  No orders of the defined  types were placed in this encounter.   AUTHORIZATION INFORMATION Primary Insurance: 1D#: Group #:  Secondary Insurance: 1D#: Group #:  SCHEDULE INFORMATION: Date: 05/28/20 Time: Location:[facility]

## 2020-05-15 ENCOUNTER — Other Ambulatory Visit: Payer: Self-pay

## 2020-05-15 DIAGNOSIS — Z1211 Encounter for screening for malignant neoplasm of colon: Secondary | ICD-10-CM

## 2020-05-15 MED ORDER — PEG 3350-KCL-NA BICARB-NACL 420 G PO SOLR: 4000.0000 mL | Freq: Once | ORAL | 0 refills | Status: AC

## 2020-05-24 ENCOUNTER — Other Ambulatory Visit
Admission: RE | Admit: 2020-05-24 | Discharge: 2020-05-24 | Disposition: A | Discharge status: Home / Self Care | Payer: BC Managed Care – PPO | Source: Ambulatory Visit | Attending: Gastroenterology | Admitting: Gastroenterology

## 2020-05-24 DIAGNOSIS — Z20822 Contact with and (suspected) exposure to covid-19: Secondary | ICD-10-CM | POA: Insufficient documentation

## 2020-05-24 DIAGNOSIS — Z01812 Encounter for preprocedural laboratory examination: Secondary | ICD-10-CM | POA: Insufficient documentation

## 2020-05-27 ENCOUNTER — Other Ambulatory Visit: Payer: Self-pay

## 2020-05-27 ENCOUNTER — Encounter: Payer: Self-pay | Admitting: Gastroenterology

## 2020-05-27 LAB — SARS CORONAVIRUS 2 (TAT 6-24 HRS): SARS Coronavirus 2: NEGATIVE

## 2020-05-28 ENCOUNTER — Encounter
Admission: RE | Disposition: A | Discharge status: Home / Self Care | Payer: Self-pay | Source: Home / Self Care | Attending: Gastroenterology

## 2020-05-28 ENCOUNTER — Ambulatory Visit
Admission: RE | Admit: 2020-05-28 | Discharge: 2020-05-28 | Disposition: A | Discharge status: Home / Self Care | Payer: BC Managed Care – PPO | Source: Home / Self Care | Attending: Gastroenterology | Admitting: Gastroenterology

## 2020-05-28 ENCOUNTER — Ambulatory Visit: Payer: BC Managed Care – PPO | Admitting: Certified Registered"

## 2020-05-28 SURGERY — COLONOSCOPY WITH PROPOFOL
Anesthesia: General

## 2020-05-28 NOTE — OR Nursing (Signed)
Presented to admission desk stating still having dk brown bowel movements. Dr. Vicente Males in to access. Procedure rescheduled.

## 2020-05-29 ENCOUNTER — Encounter: Payer: Self-pay | Admitting: Gastroenterology

## 2020-05-30 ENCOUNTER — Ambulatory Visit
Admission: RE | Admit: 2020-05-30 | Discharge: 2020-05-30 | Disposition: A | Discharge status: Home / Self Care | Payer: BC Managed Care – PPO | Attending: Gastroenterology | Admitting: Gastroenterology

## 2020-05-30 ENCOUNTER — Ambulatory Visit: Payer: BC Managed Care – PPO | Admitting: Anesthesiology

## 2020-05-30 ENCOUNTER — Encounter
Admission: RE | Disposition: A | Discharge status: Home / Self Care | Payer: Self-pay | Source: Home / Self Care | Attending: Gastroenterology

## 2020-05-30 ENCOUNTER — Other Ambulatory Visit: Payer: Self-pay

## 2020-05-30 ENCOUNTER — Encounter: Payer: Self-pay | Admitting: Gastroenterology

## 2020-05-30 DIAGNOSIS — Z1211 Encounter for screening for malignant neoplasm of colon: Secondary | ICD-10-CM | POA: Diagnosis not present

## 2020-05-30 DIAGNOSIS — K635 Polyp of colon: Secondary | ICD-10-CM

## 2020-05-30 DIAGNOSIS — D122 Benign neoplasm of ascending colon: Secondary | ICD-10-CM | POA: Diagnosis not present

## 2020-05-30 DIAGNOSIS — Z7982 Long term (current) use of aspirin: Secondary | ICD-10-CM | POA: Diagnosis not present

## 2020-05-30 DIAGNOSIS — Z7984 Long term (current) use of oral hypoglycemic drugs: Secondary | ICD-10-CM | POA: Diagnosis not present

## 2020-05-30 DIAGNOSIS — Z87891 Personal history of nicotine dependence: Secondary | ICD-10-CM | POA: Diagnosis not present

## 2020-05-30 DIAGNOSIS — Z79899 Other long term (current) drug therapy: Secondary | ICD-10-CM | POA: Insufficient documentation

## 2020-05-30 HISTORY — PX: COLONOSCOPY WITH PROPOFOL: SHX5780

## 2020-05-30 LAB — GLUCOSE, CAPILLARY: Glucose-Capillary: 101 mg/dL — ABNORMAL HIGH (ref 70–99)

## 2020-05-30 SURGERY — COLONOSCOPY WITH PROPOFOL
Anesthesia: General

## 2020-05-30 MED ORDER — PROPOFOL 10 MG/ML IV BOLUS
INTRAVENOUS | Status: DC | PRN
  Administered 2020-05-30: 100 mg via INTRAVENOUS
  Administered 2020-05-30: 20 mg via INTRAVENOUS

## 2020-05-30 MED ORDER — PROPOFOL 10 MG/ML IV BOLUS
INTRAVENOUS | Status: AC
  Filled 2020-05-30: qty 20

## 2020-05-30 MED ORDER — SODIUM CHLORIDE 0.9 % IV SOLN: INTRAVENOUS | Status: DC

## 2020-05-30 MED ORDER — PROPOFOL 500 MG/50ML IV EMUL
INTRAVENOUS | Status: DC | PRN
  Administered 2020-05-30: 100 ug/kg/min via INTRAVENOUS

## 2020-05-30 NOTE — Anesthesia Preprocedure Evaluation (Signed)
Anesthesia Evaluation  Patient identified by MRN, date of birth, ID band Patient awake    Reviewed: Allergy & Precautions, H&P , NPO status , reviewed documented beta blocker date and time   Airway Mallampati: II  TM Distance: >3 FB Neck ROM: full    Dental  (+) Chipped   Pulmonary former smoker,    Pulmonary exam normal        Cardiovascular Normal cardiovascular exam     Neuro/Psych    GI/Hepatic neg GERD  ,  Endo/Other  diabetes  Renal/GU      Musculoskeletal  (+) Arthritis ,   Abdominal   Peds  Hematology   Anesthesia Other Findings Past Medical History: 06/20/2013: Diabetes type 2, controlled ([place])     Comment:  DSME 05/2015 08/2012: Fatty liver     Comment:  hepatic steatosis per abd [place] at [facility] ER 08/30/2012: Obesity 08/26/2015: Transient arthritis  Past Surgical History: 12/2015: VASECTOMY     Comment:  [place]  BMI    Body Mass Index: 26.00 kg/m      Reproductive/Obstetrics                             Anesthesia Physical Anesthesia Plan  ASA: II  Anesthesia Plan: General   Post-op Pain Management:    Induction: Intravenous  PONV Risk Score and Plan: Treatment may vary due to age or medical condition and TIVA  Airway Management Planned: Nasal Cannula and Natural Airway  Additional Equipment:   Intra-op Plan:   Post-operative Plan:   Informed Consent: I have reviewed the patients History and Physical, chart, labs and discussed the procedure including the risks, benefits and alternatives for the proposed anesthesia with the patient or authorized representative who has indicated his/her understanding and acceptance.     Dental Advisory Given  Plan Discussed with: CRNA  Anesthesia Plan Comments:         Anesthesia Quick Evaluation

## 2020-05-30 NOTE — Anesthesia Postprocedure Evaluation (Signed)
Anesthesia Post Note  Patient: Treshaun Carrico  Procedure(s) Performed: COLONOSCOPY WITH PROPOFOL (N/A )  Patient location during evaluation: Endoscopy Anesthesia Type: General Level of consciousness: awake and alert Pain management: pain level controlled Vital Signs Assessment: post-procedure vital signs reviewed and stable Respiratory status: spontaneous breathing, nonlabored ventilation and respiratory function stable Cardiovascular status: blood pressure returned to baseline and stable Postop Assessment: no apparent nausea or vomiting Anesthetic complications: no   No complications documented.   Last Vitals:  Vitals:   05/30/20 1055 05/30/20 1105  BP: 118/67 (!) 143/93  Pulse: 66 (!) 58  Resp: 16 13  Temp:    SpO2: 99% 100%    Last Pain:  Vitals:   05/30/20 1105  TempSrc:   PainSc: 0-No pain                 Alphonsus Sias

## 2020-05-30 NOTE — Op Note (Signed)
[facility] Gastroenterology Patient Name: Tim Hawkins Procedure Date: 05/30/2020 10:16 AM MRN: [id number] Account #: [id number] Date of Birth: [birth year] Admit Type: Outpatient Age: 49 Room: [facility] ENDO ROOM 3 Gender: Male Note Status: Finalized Procedure:             Colonoscopy Indications:           Screening for colorectal malignant neoplasm Providers:             Jonathon Bellows MD, MD Referring MD:          Ria Bush (Referring MD) Medicines:             Monitored Anesthesia Care Complications:         No immediate complications. Procedure:             Pre-Anesthesia Assessment:                        - Prior to the procedure, a History and Physical was                         performed, and patient medications, allergies and                         sensitivities were reviewed. The patient's tolerance                         of previous anesthesia was reviewed.                        - The risks and benefits of the procedure and the                         sedation options and risks were discussed with the                         patient. All questions were answered and informed                         consent was obtained.                        - ASA Grade Assessment: II - A patient with mild                         systemic disease.                        After obtaining informed consent, the colonoscope was                         passed under direct vision. Throughout the procedure,                         the patient's blood pressure, pulse, and oxygen                         saturations were monitored continuously. The                         Colonoscope was introduced through the anus and  advanced to the the cecum, identified by the                         appendiceal orifice. The colonoscopy was performed                         with ease. The patient tolerated the procedure well.                         The quality of  the bowel preparation was excellent. Findings:      The perianal and digital rectal examinations were normal.      A 3 mm polyp was found in the ascending colon. The polyp was sessile.       The polyp was removed with a cold biopsy forceps. Resection and       retrieval were complete.      The exam was otherwise without abnormality on direct and retroflexion       views. Impression:            - One 3 mm polyp in the ascending colon, removed with                         a cold biopsy forceps. Resected and retrieved.                        - The examination was otherwise normal on direct and                         retroflexion views. Recommendation:        - Discharge patient to home (with escort).                        - Resume previous diet.                        - Continue present medications.                        - Await pathology results.                        - Repeat colonoscopy for surveillance based on                         pathology results. Procedure Code(s):     --- Professional ---                        [phone], Colonoscopy, flexible; with biopsy, single or                         multiple Diagnosis Code(s):     --- Professional ---                        Z12.11, Encounter for screening for malignant neoplasm                         of colon  K63.5, Polyp of colon CPT copyright 2019 American Medical Association. All rights reserved. The codes documented in this report are preliminary and upon coder review may  be revised to meet current compliance requirements. Jonathon Bellows, MD Jonathon Bellows MD, MD 05/30/2020 10:44:10 AM This report has been signed electronically. Number of Addenda: 0 Note Initiated On: 05/30/2020 10:16 AM Scope Withdrawal Time: 0 hours 18 minutes 16 seconds  Total Procedure Duration: 0 hours 20 minutes 44 seconds  Estimated Blood Loss:  Estimated blood loss: none.      [facility]

## 2020-05-30 NOTE — H&P (Signed)
Jonathon Bellows, MD [address], [place], [place], [place], [address] [address], [place], [place], [place], [address] Phone: [phone]  Fax: [phone]  Primary Care Physician:  Ria Bush, MD   Pre-Procedure History & Physical: HPI:  Tim Hawkins is a 49 y.o. male is here for an colonoscopy.   Past Medical History:  Diagnosis Date  . Diabetes type 2, controlled ([place]) 06/20/2013   DSME 05/2015  . Fatty liver 08/2012   hepatic steatosis per abd [place] at [facility] ER  . Obesity 08/30/2012  . 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 VASECTOMY  12/2015   [place]    Prior to Admission medications   Medication Sig Start Date End Date Taking? Authorizing Provider  ASPIRIN 81 PO Take 1 tablet by mouth daily.    [provider]  CINNAMON PO Take 1 capsule by mouth daily.    [provider]  glucose blood test strip 1 each by Other route daily. Use to check sugar once daily Dx: E11.9 **One Touch Ultra Mini Blue** 04/15/20   Ria Bush, MD  ketoconazole (NIZORAL) 2 % cream Apply topically 2 (two) times daily. Bid to hands and feet 11/14/19   Brendolyn Patty, MD  metFORMIN (GLUCOPHAGE) 500 MG tablet Take 1 tablet (500 mg total) by mouth 2 (two) times daily with a meal. 04/15/20   Ria Bush, MD  Multiple Vitamin (ONE-A-DAY MENS PO) Take 1 tablet by mouth daily.    [provider]  Omega-3 Fatty Acids (FISH OIL PO) Take 1 capsule by mouth daily.    [provider]  Probiotic Product (PROBIOTIC PO) Take 1 tablet by mouth daily.    [provider]  triamcinolone (NASACORT) 55 MCG/ACT AERO nasal inhaler PLACE 2 SPRAYS INTO THE NOSE DAILY. 02/02/20   Ria Bush, MD    Allergies as of 05/28/2020  . (No Known Allergies)    Family History  Problem Relation Age of Onset  . Diabetes Father 69  . CAD Father 60       MI  . Diabetes Mother        Pre-diabetes  . Cancer  Neg Hx   . Stroke Neg Hx   . Kidney disease Neg Hx   . Prostate cancer Neg Hx     Social History   Socioeconomic History  . Marital status: Single    Spouse name: Not on file  . Number of children: Not on file  . Years of education: Not on file  . Highest education level: Not on file  Occupational History  . Not on file  Tobacco Use  . Smoking status: Former Smoker    Packs/day: 0.25    Years: 5.00    Pack years: 1.25    Types: Cigars    Quit date: 07/30/2012    Years since quitting: 7.8  . Smokeless tobacco: Current User  . Tobacco comment: e cig  Vaping Use  . Vaping Use: Every day  Substance and Sexual Activity  . Alcohol use: Yes    Alcohol/week: 0.0 standard drinks    Comment: Rare 1-2 per month  . Drug use: No  . Sexual activity: Not on file  Other Topics Concern  . Not on file  Social History Narrative   Lives with [name] significant other.  No pets   Occupation: truck Geophysicist/field seismologist   Activity: tries to walk regularly   Diet: good water, tries daily fruits/vegetables  Social Determinants of Health   Financial Resource Strain:   . Difficulty of Paying Living Expenses: Not on file  Food Insecurity:   . Worried About Charity fundraiser in the Last Year: Not on file  . Ran Out of Food in the Last Year: Not on file  Transportation Needs:   . Lack of Transportation (Medical): Not on file  . Lack of Transportation (Non-Medical): Not on file  Physical Activity:   . Days of Exercise per Week: Not on file  . Minutes of Exercise per Session: Not on file  Stress:   . Feeling of Stress : Not on file  Social Connections:   . Frequency of Communication with Friends and Family: Not on file  . Frequency of Social Gatherings with Friends and Family: Not on file  . Attends Religious Services: Not on file  . Active Member of Clubs or Organizations: Not on file  . Attends Archivist Meetings: Not on file  . Marital Status: Not on file  Intimate Partner Violence:     . Fear of Current or Ex-Partner: Not on file  . Emotionally Abused: Not on file  . Physically Abused: Not on file  . Sexually Abused: Not on file    Review of Systems: See HPI, otherwise negative ROS  Physical Exam: BP (!) 148/82   Pulse 88   Temp (!) 97.3 F (36.3 C) (Temporal)   Resp 16   Ht 5\' 7"  (1.702 m)   Wt 75.3 kg   SpO2 100%   BMI 26.00 kg/m  General:   Alert,  pleasant and cooperative in NAD Head:  Normocephalic and atraumatic. Neck:  Supple; no masses or thyromegaly. Lungs:  Clear throughout to auscultation, normal respiratory effort.    Heart:  +S1, +S2, Regular rate and rhythm, No edema. Abdomen:  Soft, nontender and nondistended. Normal bowel sounds, without guarding, and without rebound.   Neurologic:  Alert and  oriented x4;  grossly normal neurologically.  Impression/Plan: Mozell Hardacre is here for an colonoscopy to be performed for Screening colonoscopy average risk   Risks, benefits, limitations, and alternatives regarding  colonoscopy have been reviewed with the patient.  Questions have been answered.  All parties agreeable.   Jonathon Bellows, MD  05/30/2020, 10:11 AM

## 2020-05-30 NOTE — Transfer of Care (Signed)
Immediate Anesthesia Transfer of Care Note  Patient: Arvo Ealy  Procedure(s) Performed: COLONOSCOPY WITH PROPOFOL (N/A )  Patient Location: Endoscopy Unit  Anesthesia Type:General  Level of Consciousness: drowsy and patient cooperative  Airway & Oxygen Therapy: Patient Spontanous Breathing  Post-op Assessment: Report given to RN and Post -op Vital signs reviewed and stable  Post vital signs: Reviewed and stable  Last Vitals:  Vitals Value Taken Time  BP 107/57 05/30/20 1046  Temp    Pulse 73 05/30/20 1047  Resp 18 05/30/20 1047  SpO2 100 % 05/30/20 1047  Vitals shown include unvalidated device data.  Last Pain:  Vitals:   05/30/20 1045  TempSrc:   PainSc: 0-No pain         Complications: No complications documented.

## 2020-05-31 ENCOUNTER — Encounter: Payer: Self-pay | Admitting: Gastroenterology

## 2020-05-31 LAB — SURGICAL PATHOLOGY

## 2020-06-01 ENCOUNTER — Other Ambulatory Visit: Payer: Self-pay | Admitting: Family Medicine

## 2020-06-01 ENCOUNTER — Encounter: Payer: Self-pay | Admitting: Family Medicine

## 2020-06-01 DIAGNOSIS — E1169 Type 2 diabetes mellitus with other specified complication: Secondary | ICD-10-CM

## 2020-06-01 DIAGNOSIS — E785 Hyperlipidemia, unspecified: Secondary | ICD-10-CM

## 2020-06-01 DIAGNOSIS — IMO0002 Reserved for concepts with insufficient information to code with codable children: Secondary | ICD-10-CM

## 2020-06-01 DIAGNOSIS — E1165 Type 2 diabetes mellitus with hyperglycemia: Secondary | ICD-10-CM

## 2020-06-03 ENCOUNTER — Encounter: Payer: Self-pay | Admitting: Gastroenterology

## 2020-06-04 ENCOUNTER — Other Ambulatory Visit (INDEPENDENT_AMBULATORY_CARE_PROVIDER_SITE_OTHER): Payer: BC Managed Care – PPO

## 2020-06-04 ENCOUNTER — Other Ambulatory Visit: Payer: Self-pay

## 2020-06-04 DIAGNOSIS — E1165 Type 2 diabetes mellitus with hyperglycemia: Secondary | ICD-10-CM

## 2020-06-04 DIAGNOSIS — IMO0002 Reserved for concepts with insufficient information to code with codable children: Secondary | ICD-10-CM

## 2020-06-04 DIAGNOSIS — E1169 Type 2 diabetes mellitus with other specified complication: Secondary | ICD-10-CM

## 2020-06-04 DIAGNOSIS — E785 Hyperlipidemia, unspecified: Secondary | ICD-10-CM | POA: Diagnosis not present

## 2020-06-04 DIAGNOSIS — E118 Type 2 diabetes mellitus with unspecified complications: Secondary | ICD-10-CM

## 2020-06-04 LAB — LIPID PANEL
Cholesterol: 136 mg/dL (ref 0–200)
HDL: 34.6 mg/dL — ABNORMAL LOW
LDL Cholesterol: 85 mg/dL (ref 0–99)
NonHDL: 101.52
Total CHOL/HDL Ratio: 4
Triglycerides: 85 mg/dL (ref 0.0–149.0)
VLDL: 17 mg/dL (ref 0.0–40.0)

## 2020-06-04 LAB — BASIC METABOLIC PANEL
BUN: 14 mg/dL (ref 6–23)
CO2: 30 mEq/L (ref 19–32)
Calcium: 9.4 mg/dL (ref 8.4–10.5)
Chloride: 101 mEq/L (ref 96–112)
Creatinine, Ser: 0.43 mg/dL (ref 0.40–1.50)
GFR: 123.79 mL/min (ref >60.00)
Glucose, Bld: 99 mg/dL (ref 70–99)
Potassium: 4.2 mEq/L (ref 3.5–5.1)
Sodium: 137 mEq/L (ref 135–145)

## 2020-06-04 LAB — HEMOGLOBIN A1C: Hgb A1c MFr Bld: 5.8 % (ref 4.6–6.5)

## 2020-06-11 ENCOUNTER — Other Ambulatory Visit: Payer: Self-pay

## 2020-06-11 ENCOUNTER — Encounter: Payer: Self-pay | Admitting: Family Medicine

## 2020-06-11 ENCOUNTER — Ambulatory Visit: Payer: BC Managed Care – PPO | Admitting: Family Medicine

## 2020-06-11 VITALS — BP 126/68 | HR 79 | Temp 97.6°F | Ht 67.0 in | Wt 170.2 lb

## 2020-06-11 DIAGNOSIS — E1169 Type 2 diabetes mellitus with other specified complication: Secondary | ICD-10-CM

## 2020-06-11 DIAGNOSIS — Z23 Encounter for immunization: Secondary | ICD-10-CM | POA: Diagnosis not present

## 2020-06-11 DIAGNOSIS — E118 Type 2 diabetes mellitus with unspecified complications: Secondary | ICD-10-CM | POA: Diagnosis not present

## 2020-06-11 DIAGNOSIS — E785 Hyperlipidemia, unspecified: Secondary | ICD-10-CM | POA: Diagnosis not present

## 2020-06-11 DIAGNOSIS — E663 Overweight: Secondary | ICD-10-CM | POA: Diagnosis not present

## 2020-06-11 MED ORDER — METFORMIN HCL 500 MG PO TABS
500.0000 mg | ORAL_TABLET | Freq: Every day | ORAL | 3 refills | Status: DC
Start: 2020-06-11 — End: 2021-08-27

## 2020-06-11 NOTE — Patient Instructions (Addendum)
First shingles shot today. Return in 2-6 months for nurse visit to complete series.  Weight loss is helping to reverse diabetes - drop metformin to 500mg  once daily.  Congratulations on weight loss through healthy changes to date!  Return as needed or in 4 months for follow up visit.

## 2020-06-11 NOTE — Assessment & Plan Note (Signed)
Marked LDL improvement 116 --> 85 with diet changes and weight loss. He is reversing diabetes - will stay off statin at this time.  The 10-year ASCVD risk score Mikey Bussing DC Brooke Bonito., et al., 2013) is: 6.1%   Values used to calculate the score:     Age: 49 years     Sex: Male     Is Non-Hispanic African American: No     Diabetic: Yes     Tobacco smoker: No     Systolic Blood Pressure: 035 mmHg     Is BP treated: No     HDL Cholesterol: 34.6 mg/dL     Total Cholesterol: 136 mg/dL

## 2020-06-11 NOTE — Progress Notes (Signed)
Patient ID: Tim Hawkins, male    DOB: May 19, 1971, 49 y.o.   MRN: [id number]  This visit was conducted in person.  BP 126/68 (BP Location: Left Arm, Patient Position: Sitting, Cuff Size: Normal)   Pulse 79   Temp 97.6 F (36.4 C) (Temporal)   Ht 5\' 7"  (1.702 m)   Wt 170 lb 3 oz (77.2 kg)   SpO2 99%   BMI 26.66 kg/m    CC: 2 mo f/u visit  Subjective:   HPI: Tim Hawkins is a 49 y.o. male presenting on 06/11/2020 for Follow-up (Here for 2 mo chol f/u.)   Down another 17 lbs - started optavia meal replacement plan 01/2020. Goal 160 lbs. 40 lbs since starting.  Recently completed colonoscopy (1 TA).   DM - does regularly check sugars daily 100-120s. Compliant with antihyperglycemic regimen which includes: metformin 500mg  bid. Denies low sugars or hypoglycemic symptoms. Denies paresthesias. Last diabetic eye exam DUE. Pneumovax: 2016. Prevnar: not due. Glucometer brand: one touch ultra mini. DSME: 05/2015. Lab Results  Component Value Date   HGBA1C 5.8 06/04/2020   Diabetic Foot Exam - Simple   Simple Foot Form Diabetic Foot exam was performed with the following findings: Yes 06/11/2020 11:31 AM  Visual Inspection No deformities, no ulcerations, no other skin breakdown bilaterally: Yes Sensation Testing Intact to touch and monofilament testing bilaterally: Yes Pulse Check Posterior Tibialis and Dorsalis pulse intact bilaterally: Yes Comments    Lab Results  Component Value Date   MICROALBUR <0.7 01/26/2020        Relevant past medical, surgical, family and social history reviewed and updated as indicated. Interim medical history since our last visit reviewed. Allergies and medications reviewed and updated. Outpatient Medications Prior to Visit  Medication Sig Dispense Refill  . ASPIRIN 81 PO Take 1 tablet by mouth daily.    Marland Kitchen CINNAMON PO Take 1 capsule by mouth daily.    Marland Kitchen glucose blood test strip 1 each by Other route daily. Use to check sugar once daily Dx:  E11.9 **One Touch Ultra Mini Blue** 100 each 3  . ketoconazole (NIZORAL) 2 % cream Apply topically 2 (two) times daily. Bid to hands and feet 60 g 0  . Multiple Vitamin (ONE-A-DAY MENS PO) Take 1 tablet by mouth daily.    . Omega-3 Fatty Acids (FISH OIL PO) Take 1 capsule by mouth daily.    . Probiotic Product (PROBIOTIC PO) Take 1 tablet by mouth daily.    Marland Kitchen triamcinolone (NASACORT) 55 MCG/ACT AERO nasal inhaler PLACE 2 SPRAYS INTO THE NOSE DAILY. 1 each 1  . metFORMIN (GLUCOPHAGE) 500 MG tablet Take 1 tablet (500 mg total) by mouth 2 (two) times daily with a meal. 180 tablet 3   No facility-administered medications prior to visit.     Per HPI unless specifically indicated in ROS section below Review of Systems Objective:  BP 126/68 (BP Location: Left Arm, Patient Position: Sitting, Cuff Size: Normal)   Pulse 79   Temp 97.6 F (36.4 C) (Temporal)   Ht 5\' 7"  (1.702 m)   Wt 170 lb 3 oz (77.2 kg)   SpO2 99%   BMI 26.66 kg/m   Wt Readings from Last 3 Encounters:  06/11/20 170 lb 3 oz (77.2 kg)  05/30/20 166 lb (75.3 kg)  04/15/20 187 lb 1.6 oz (84.9 kg)      Physical Exam Vitals and nursing note reviewed.  Constitutional:      General: He is not in acute distress.  Appearance: Normal appearance. He is well-developed and well-nourished. He is not ill-appearing.  HENT:     Head: Normocephalic and atraumatic.     Mouth/Throat:     Mouth: Oropharynx is clear and moist.  Eyes:     General: No scleral icterus.    Extraocular Movements: Extraocular movements intact and EOM normal.     Conjunctiva/sclera: Conjunctivae normal.     Pupils: Pupils are equal, round, and reactive to light.  Cardiovascular:     Rate and Rhythm: Normal rate and regular rhythm.     Pulses: Normal pulses and intact distal pulses.     Heart sounds: Normal heart sounds. No murmur heard.   Pulmonary:     Effort: Pulmonary effort is normal. No respiratory distress.     Breath sounds: Normal breath  sounds. No wheezing, rhonchi or rales.  Musculoskeletal:        General: No edema.     Cervical back: Normal range of motion and neck supple.     Right lower leg: No edema.     Left lower leg: No edema.     Comments: See HPI for foot exam if done  Lymphadenopathy:     Cervical: No cervical adenopathy.  Skin:    General: Skin is warm and dry.     Findings: No rash.  Neurological:     Mental Status: He is alert.  Psychiatric:        Mood and Affect: Mood and affect normal.       Results for orders placed or performed in visit on 06/04/20  Lipid panel  Result Value Ref Range   Cholesterol 136 0 - 200 mg/dL   Triglycerides 85.0 0.0 - 149.0 mg/dL   HDL 34.60 (L) >39.00 mg/dL   VLDL 17.0 0.0 - 40.0 mg/dL   LDL Cholesterol 85 0 - 99 mg/dL   Total CHOL/HDL Ratio 4    NonHDL 101.52   Hemoglobin A1c  Result Value Ref Range   Hgb A1c MFr Bld 5.8 4.6 - 6.5 %  Basic metabolic panel  Result Value Ref Range   Sodium 137 135 - 145 mEq/L   Potassium 4.2 3.5 - 5.1 mEq/L   Chloride 101 96 - 112 mEq/L   CO2 30 19 - 32 mEq/L   Glucose, Bld 99 70 - 99 mg/dL   BUN 14 6 - 23 mg/dL   Creatinine, Ser 0.43 0.40 - 1.50 mg/dL   GFR 123.79 >60.00 mL/min   Calcium 9.4 8.4 - 10.5 mg/dL   Assessment & Plan:  This visit occurred during the SARS-CoV-2 public health emergency.  Safety protocols were in place, including screening questions prior to the visit, additional usage of staff PPE, and extensive cleaning of exam room while observing appropriate contact time as indicated for disinfecting solutions.   Forgot to administer shingrix vaccine - will see if he wants to return for this.  Problem List Items Addressed This Visit    Overweight (BMI 25.0-29.9)    Congratulated on weight loss to date - total 40 lb weight loss since 01/2020.  Motivated to continue efforts to goal weight 160 lbs.  He has been using optavia meal replacement diet plan.       Dyslipidemia associated with type 2 diabetes  mellitus ([facility])    Marked LDL improvement 116 --> 85 with diet changes and weight loss. He is reversing diabetes - will stay off statin at this time.  The 10-year ASCVD risk score Mikey Bussing DC Brooke Bonito., et al.,  2013) is: 6.1%   Values used to calculate the score:     Age: 49 years     Sex: Male     Is Non-Hispanic African American: No     Diabetic: Yes     Tobacco smoker: No     Systolic Blood Pressure: 244 mmHg     Is BP treated: No     HDL Cholesterol: 34.6 mg/dL     Total Cholesterol: 136 mg/dL       Relevant Medications   metFORMIN (GLUCOPHAGE) 500 MG tablet   Controlled diabetes mellitus type 2 with complications ([place]) - Primary    Congratulated on weight loss and healthy efforts to date.  He is motivated to continue weight loss journey.  Now in prediabetes range.  Will drop metformin to 500mg  once daily.       Relevant Medications   metFORMIN (GLUCOPHAGE) 500 MG tablet    Other Visit Diagnoses    Need for shingles vaccine       Relevant Orders   Varicella-zoster vaccine IM (Completed)       Meds ordered this encounter  Medications  . metFORMIN (GLUCOPHAGE) 500 MG tablet    Sig: Take 1 tablet (500 mg total) by mouth daily with breakfast.    Dispense:  90 tablet    Refill:  3    Note new dose   Orders Placed This Encounter  Procedures  . Varicella-zoster vaccine IM    Patient Instructions  First shingles shot today. Return in 2-6 months for nurse visit to complete series.  Weight loss is helping to reverse diabetes - drop metformin to 500mg  once daily.  Congratulations on weight loss through healthy changes to date!  Return as needed or in 4 months for follow up visit.    Follow up plan: Return in about 4 months (around 10/10/2020), or if symptoms worsen or fail to improve, for follow up visit.  Ria Bush, MD

## 2020-06-11 NOTE — Assessment & Plan Note (Addendum)
Congratulated on weight loss to date - total 40 lb weight loss since 01/2020.  Motivated to continue efforts to goal weight 160 lbs.  He has been using optavia meal replacement diet plan.

## 2020-06-11 NOTE — Assessment & Plan Note (Signed)
Congratulated on weight loss and healthy efforts to date.  He is motivated to continue weight loss journey.  Now in prediabetes range.  Will drop metformin to 500mg  once daily.

## 2020-06-12 ENCOUNTER — Ambulatory Visit: Payer: BC Managed Care – PPO | Admitting: Family Medicine

## 2020-07-22 DIAGNOSIS — Z03818 Encounter for observation for suspected exposure to other biological agents ruled out: Secondary | ICD-10-CM | POA: Diagnosis not present

## 2020-07-22 DIAGNOSIS — Z20822 Contact with and (suspected) exposure to covid-19: Secondary | ICD-10-CM | POA: Diagnosis not present

## 2020-10-14 ENCOUNTER — Ambulatory Visit: Payer: BC Managed Care – PPO | Admitting: Family Medicine

## 2020-10-28 ENCOUNTER — Other Ambulatory Visit: Payer: Self-pay

## 2020-10-28 ENCOUNTER — Encounter: Payer: Self-pay | Admitting: Family Medicine

## 2020-10-28 ENCOUNTER — Ambulatory Visit: Payer: BC Managed Care – PPO | Admitting: Family Medicine

## 2020-10-28 ENCOUNTER — Telehealth: Payer: Self-pay

## 2020-10-28 VITALS — BP 122/64 | HR 75 | Temp 98.0°F | Ht 67.0 in | Wt 178.4 lb

## 2020-10-28 DIAGNOSIS — E118 Type 2 diabetes mellitus with unspecified complications: Secondary | ICD-10-CM

## 2020-10-28 DIAGNOSIS — Z23 Encounter for immunization: Secondary | ICD-10-CM | POA: Diagnosis not present

## 2020-10-28 DIAGNOSIS — J309 Allergic rhinitis, unspecified: Secondary | ICD-10-CM

## 2020-10-28 DIAGNOSIS — Z1283 Encounter for screening for malignant neoplasm of skin: Secondary | ICD-10-CM | POA: Diagnosis not present

## 2020-10-28 LAB — POCT GLYCOSYLATED HEMOGLOBIN (HGB A1C): Hemoglobin A1C: 6.1 % — AB (ref 4.0–5.6)

## 2020-10-28 MED ORDER — TRIAMCINOLONE ACETONIDE 55 MCG/ACT NA AERO: 2.0000 | INHALATION_SPRAY | Freq: Every day | NASAL | 1 refills | Status: DC

## 2020-10-28 MED ORDER — POLYETHYLENE GLYCOL 3350 17 GM/SCOOP PO POWD
8.5000 g | Freq: Every day | ORAL | 1 refills | Status: AC | PRN
Start: 2020-10-28 — End: ?

## 2020-10-28 MED ORDER — TRIAMCINOLONE ACETONIDE 55 MCG/ACT NA AERO: 2.0000 | INHALATION_SPRAY | Freq: Every day | NASAL | 6 refills | Status: AC

## 2020-10-28 NOTE — Patient Instructions (Addendum)
For constipation - try prunes or kiwi. Start miralax 1/2-1 capful daily.  You are doing well today Continue metformin.  Return for physical in October  We will refer you for skin screening by dermatologist.

## 2020-10-28 NOTE — Assessment & Plan Note (Signed)
Chronic, stable. Continue low dose metformin. Well controlled.

## 2020-10-28 NOTE — Progress Notes (Signed)
Patient ID: Tim Hawkins, male    DOB: 07/18/1970, 50 y.o.   MRN: [id number]  This visit was conducted in person.  BP 122/64   Pulse 75   Temp 98 F (36.7 C) (Temporal)   Ht 5\' 7"  (1.702 m)   Wt 178 lb 6 oz (80.9 kg)   SpO2 100%   BMI 27.94 kg/m    CC: 4 mo DM f/u visit  Subjective:   HPI: Tim Hawkins is a 50 y.o. male presenting on 10/28/2020 for Diabetes (Here for 4 mo f/u.)   Constipation since colonoscopy despite stool softener and fiber. No abd pain. 1 BM daily, but very hard and has to strain.   DM - does regularly check sugars throughout the day 100-130. Compliant with antihyperglycemic regimen which includes: metformin 500mg  daily. Denies low sugars or hypoglycemic symptoms. Denies paresthesias. Last diabetic eye exam DUE - upcoming appt 11/2020. Glucometer brand: onetouch. DSME: completed 2016.  Lab Results  Component Value Date   HGBA1C 6.1 (A) 10/28/2020   Diabetic Foot Exam - Simple   Simple Foot Form Diabetic Foot exam was performed with the following findings: Yes 10/28/2020 11:06 AM  Visual Inspection No deformities, no ulcerations, no other skin breakdown bilaterally: Yes Sensation Testing Intact to touch and monofilament testing bilaterally: Yes Pulse Check Posterior Tibialis and Dorsalis pulse intact bilaterally: Yes Comments    Lab Results  Component Value Date   MICROALBUR <0.7 01/26/2020         Relevant past medical, surgical, family and social history reviewed and updated as indicated. Interim medical history since our last visit reviewed. Allergies and medications reviewed and updated. Outpatient Medications Prior to Visit  Medication Sig Dispense Refill  . ASPIRIN 81 PO Take 1 tablet by mouth daily.    Marland Kitchen CINNAMON PO Take 1 capsule by mouth daily.    Mariane Baumgarten Calcium (STOOL SOFTENER PO) Take by mouth. As needed    . glucose blood test strip 1 each by Other route daily. Use to check sugar once daily Dx: E11.9 **One Touch Ultra Mini  Blue** 100 each 3  . ketoconazole (NIZORAL) 2 % cream Apply topically 2 (two) times daily. Bid to hands and feet (Patient taking differently: Apply topically 2 (two) times daily. Bid to hands and feet as needed) 60 g 0  . metFORMIN (GLUCOPHAGE) 500 MG tablet Take 1 tablet (500 mg total) by mouth daily with breakfast. 90 tablet 3  . Multiple Vitamin (ONE-A-DAY MENS PO) Take 1 tablet by mouth daily.    . Omega-3 Fatty Acids (FISH OIL PO) Take 1 capsule by mouth daily.    . Probiotic Product (PROBIOTIC PO) Take 1 tablet by mouth daily.    Marland Kitchen triamcinolone (NASACORT) 55 MCG/ACT AERO nasal inhaler PLACE 2 SPRAYS INTO THE NOSE DAILY. 1 each 1   No facility-administered medications prior to visit.     Per HPI unless specifically indicated in ROS section below Review of Systems Objective:  BP 122/64   Pulse 75   Temp 98 F (36.7 C) (Temporal)   Ht 5\' 7"  (1.702 m)   Wt 178 lb 6 oz (80.9 kg)   SpO2 100%   BMI 27.94 kg/m   Wt Readings from Last 3 Encounters:  10/28/20 178 lb 6 oz (80.9 kg)  06/11/20 170 lb 3 oz (77.2 kg)  05/30/20 166 lb (75.3 kg)      Physical Exam Vitals and nursing note reviewed.  Constitutional:      General: He  is not in acute distress.    Appearance: Normal appearance. He is well-developed. He is not ill-appearing.  Eyes:     General: No scleral icterus.    Extraocular Movements: Extraocular movements intact.     Conjunctiva/sclera: Conjunctivae normal.     Pupils: Pupils are equal, round, and reactive to light.  Cardiovascular:     Rate and Rhythm: Normal rate and regular rhythm.     Pulses: Normal pulses.     Heart sounds: Normal heart sounds. No murmur heard.   Pulmonary:     Effort: Pulmonary effort is normal. No respiratory distress.     Breath sounds: Normal breath sounds. No wheezing, rhonchi or rales.  Musculoskeletal:     Cervical back: Normal range of motion and neck supple.     Comments: See HPI for foot exam if done  Lymphadenopathy:      Cervical: No cervical adenopathy.  Skin:    General: Skin is warm and dry.     Findings: No rash.     Comments: Multiple nevi throughout skin  Neurological:     Mental Status: He is alert.  Psychiatric:        Mood and Affect: Mood normal.        Behavior: Behavior normal.       Results for orders placed or performed in visit on 10/28/20  POCT glycosylated hemoglobin (Hb A1C)  Result Value Ref Range   Hemoglobin A1C 6.1 (A) 4.0 - 5.6 %   HbA1c POC (<> result, manual entry)     HbA1c, POC (prediabetic range)     HbA1c, POC (controlled diabetic range)     Assessment & Plan:  This visit occurred during the SARS-CoV-2 public health emergency.  Safety protocols were in place, including screening questions prior to the visit, additional usage of staff PPE, and extensive cleaning of exam room while observing appropriate contact time as indicated for disinfecting solutions.   Problem List Items Addressed This Visit    Controlled diabetes mellitus type 2 with complications ([place]) - Primary    Chronic, stable. Continue low dose metformin. Well controlled.       Relevant Orders   POCT glycosylated hemoglobin (Hb A1C) (Completed)   Allergic rhinitis    Rx nasacort to use PRN      Relevant Medications   triamcinolone (NASACORT) 55 MCG/ACT AERO nasal inhaler    Other Visit Diagnoses    Need for shingles vaccine       Relevant Orders   Varicella-zoster vaccine IM (Completed)   Skin cancer screening       Relevant Orders   Ambulatory referral to Dermatology       Meds ordered this encounter  Medications  . DISCONTD: triamcinolone (NASACORT) 55 MCG/ACT AERO nasal inhaler    Sig: Place 2 sprays into the nose daily.    Dispense:  1 each    Refill:  1  . DISCONTD: triamcinolone (NASACORT) 55 MCG/ACT AERO nasal inhaler    Sig: Place 2 sprays into the nose daily.    Dispense:  1 each    Refill:  1  . polyethylene glycol powder (GLYCOLAX/MIRALAX) 17 GM/SCOOP powder    Sig: Take  8.5-17 g by mouth daily as needed for moderate constipation.    Dispense:  3350 g    Refill:  1  . triamcinolone (NASACORT) 55 MCG/ACT AERO nasal inhaler    Sig: Place 2 sprays into the nose daily.    Dispense:  1 each  Refill:  6   Orders Placed This Encounter  Procedures  . Varicella-zoster vaccine IM  . Ambulatory referral to Dermatology    Referral Priority:   Routine    Referral Type:   Consultation    Referral Reason:   Specialty Services Required    Requested Specialty:   Dermatology    Number of Visits Requested:   1  . POCT glycosylated hemoglobin (Hb A1C)    Patient Instructions  For constipation - try prunes or kiwi. Start miralax 1/2-1 capful daily.  You are doing well today Continue metformin.  Return for physical in October  We will refer you for skin screening by dermatologist.    Follow up plan: Return if symptoms worsen or fail to improve.  Ria Bush, MD

## 2020-10-28 NOTE — Telephone Encounter (Signed)
[place] Night - Client Nonclinical Telephone Record AccessNurse Client [place] Night - Client Client Site [place] Physician Ria Bush - MD Contact Type Call Who Is Calling Patient / Member / Family / Caregiver Caller Name Tim Hawkins Caller Phone Number [phone] Patient Name Tim Hawkins Patient DOB 11-30-1970 Call Type Message Only Information Provided Reason for Call Request for General Office Information Initial Comment Caller states he is returning a call to confirm his appt for Monday. He will be there. Additional Comment Office hours provided. Disp. Time Disposition Final User 10/25/2020 5:45:32 PM General Information Provided Yes Susette Racer Call Closed By: Susette Racer Transaction Date/Time: 10/25/2020 5:43:44 PM (ET)

## 2020-10-28 NOTE — Assessment & Plan Note (Signed)
Rx nasacort to use PRN

## 2020-10-28 NOTE — Telephone Encounter (Signed)
I verified with pt that he will be at his appt today at 10>30 and nothing further needed.

## 2020-11-01 ENCOUNTER — Telehealth: Payer: Self-pay

## 2020-11-01 NOTE — Telephone Encounter (Signed)
Spoke with patients wife. Informed her of Dr. Georgeann Oppenheim recommendations regarding constipation. Offered a new patient visit appt for next week. Pt's wife verbalized understanding.

## 2020-11-01 NOTE — Telephone Encounter (Signed)
Wife says patient hasn't had a bm in 2 days. Has taken Miralax, magnesium calm, Metamucil, and prune juice. Nothing seems to work. Please call to advise. Says he's been irregular since the colon procedure in November. Patient has a Mychart, If he doesn't answer please respond via [facility].

## 2020-11-04 ENCOUNTER — Ambulatory Visit: Payer: BC Managed Care – PPO | Admitting: Gastroenterology

## 2020-11-04 ENCOUNTER — Encounter: Payer: Self-pay | Admitting: Gastroenterology

## 2020-11-04 ENCOUNTER — Other Ambulatory Visit: Payer: Self-pay

## 2020-11-04 VITALS — BP 149/76 | HR 82 | Ht 67.0 in | Wt 180.6 lb

## 2020-11-04 DIAGNOSIS — K5900 Constipation, unspecified: Secondary | ICD-10-CM | POA: Diagnosis not present

## 2020-11-04 NOTE — Progress Notes (Signed)
Jonathon Bellows MD, MRCP(U.K) [address]  [place]  [place], [place] [address]  Main: [phone]  Fax: [phone]   Gastroenterology Consultation  Referring Provider:     Ria Bush, MD Primary Care Physician:  Ria Bush, MD Primary Gastroenterologist:  Dr. Jonathon Bellows  Reason for Consultation:     Constipation        HPI:   Tim Hawkins is a 50 y.o. y/o male referred for consultation & management  by Dr. Ria Bush, MD.     He is here today to see me for constipation.  I performed a colonoscopy in December 2021 for colon cancer screening and a 3 mm polyp was resected in the ascending colon which was a tubular adenoma.  He has a HbA1c of 5.8.   Constipation: Onset began after his colonoscopy Frequency of bowel movements and had a bowel movement for a few days until he started MiraLAX Consistency was hard but better after starting MiraLAX Shape no new changes, any new change Pain no pain Bloating/abdominal distension yes Bleeding no Last colonoscopy as above Weight loss none Family history of colon cancer none Diet low in fiber  Thyroid abnormalities none  He does admit that his diet has been poor of fiber recently.  Past Medical History:  Diagnosis Date  . Diabetes type 2, controlled ([place]) 06/20/2013   DSME 05/2015  . Fatty liver 08/2012   hepatic steatosis per abd [place] at [facility]  . Obesity 08/30/2012  . 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 COLONOSCOPY WITH PROPOFOL N/A 05/30/2020   TA, rpt 7 yrs Vicente Males, Bailey Mech, MD)  . VASECTOMY  12/2015   [place]    Prior to Admission medications   Medication Sig Start Date End Date Taking? Authorizing Provider  ASPIRIN 81 PO Take 1 tablet by mouth daily.    [provider]  CINNAMON PO Take 1 capsule by mouth daily.    [provider]  Docusate Calcium (STOOL SOFTENER PO) Take by mouth. As needed    [provider]   glucose blood test strip 1 each by Other route daily. Use to check sugar once daily Dx: E11.9 **One Touch Ultra Mini Blue** 04/15/20   Ria Bush, MD  ketoconazole (NIZORAL) 2 % cream Apply topically 2 (two) times daily. Bid to hands and feet Patient taking differently: Apply topically 2 (two) times daily. Bid to hands and feet as needed 11/14/19   Brendolyn Patty, MD  metFORMIN (GLUCOPHAGE) 500 MG tablet Take 1 tablet (500 mg total) by mouth daily with breakfast. 06/11/20   Ria Bush, MD  Multiple Vitamin (ONE-A-DAY MENS PO) Take 1 tablet by mouth daily.    [provider]  Omega-3 Fatty Acids (FISH OIL PO) Take 1 capsule by mouth daily.    [provider]  polyethylene glycol powder (GLYCOLAX/MIRALAX) 17 GM/SCOOP powder Take 8.5-17 g by mouth daily as needed for moderate constipation. 10/28/20   Ria Bush, MD  Probiotic Product (PROBIOTIC PO) Take 1 tablet by mouth daily.    [provider]  triamcinolone (NASACORT) 55 MCG/ACT AERO nasal inhaler Place 2 sprays into the nose daily. 10/28/20   Ria Bush, MD    Family History  Problem Relation Age of Onset  . Diabetes Father 24  . CAD Father 30       MI  . Diabetes Mother        Pre-diabetes  . Cancer Neg Hx   .  Stroke Neg Hx   . Kidney disease Neg Hx   . Prostate cancer Neg Hx      Social History   Tobacco Use  . Smoking status: Former Smoker    Packs/day: 0.25    Years: 5.00    Pack years: 1.25    Types: Cigars    Quit date: 07/30/2012    Years since quitting: 8.2  . Smokeless tobacco: Current User  . Tobacco comment: e cig  Vaping Use  . Vaping Use: Every day  Substance Use Topics  . Alcohol use: Yes    Alcohol/week: 0.0 standard drinks    Comment: Rare 1-2 per month  . Drug use: No    Allergies as of 11/04/2020  . (No Known Allergies)    Review of Systems:    All systems reviewed and negative except where noted in HPI.   Physical Exam:  BP (!) 149/76 (BP  Location: Left Arm, Patient Position: Sitting, Cuff Size: Large)   Pulse 82   Ht 5\' 7"  (1.702 m)   Wt 180 lb 9.6 oz (81.9 kg)   BMI 28.29 kg/m  No LMP for male patient. Psych:  Alert and cooperative. Normal mood and affect. General:   Alert,  Well-developed, well-nourished, pleasant and cooperative in NAD Head:  Normocephalic and atraumatic. Eyes:  Sclera clear, no icterus.   Conjunctiva pink. Ears:  Normal auditory acuity. Lungs:  Respirations even and unlabored.  Clear throughout to auscultation.   No wheezes, crackles, or rhonchi. No acute distress. Heart:  Regular rate and rhythm; no murmurs, clicks, rubs, or gallops. Abdomen:  Normal bowel sounds.  No bruits.  Soft, non-tender and non-distended without masses, hepatosplenomegaly or hernias noted.  No guarding or rebound tenderness.    Neurologic:  Alert and oriented x3;  grossly normal neurologically. Psych:  Alert and cooperative. Normal mood and affect.  Imaging Studies: No results found.  Assessment and Plan:   Tim Hawkins is a 50 y.o. y/o male has been referred for constipation.  Normal colonoscopy except for diminutive tubular adenoma in 2022.  Very likely constipation due to low fiber content in diet  Plan 1.  High-fiber diet provided patient information on the same and counseled. 2.  If no better with a high-fiber diet and MiraLAX can provide Linz[place].  I have advised him to send me a message in about 10 to 15 days if he is not feeling any better. 3.  Check TSH   Follow up as needed  Dr Jonathon Bellows MD,MRCP(U.K)

## 2020-11-04 NOTE — Patient Instructions (Signed)
Samples of fiber pills were given to patient today in office. (3)

## 2020-11-05 LAB — TSH: TSH: 1.97 u[IU]/mL (ref 0.450–4.500)

## 2021-04-21 ENCOUNTER — Other Ambulatory Visit: Payer: BC Managed Care – PPO

## 2021-04-22 ENCOUNTER — Other Ambulatory Visit: Payer: BC Managed Care – PPO

## 2021-04-28 ENCOUNTER — Encounter: Payer: BC Managed Care – PPO | Admitting: Family Medicine

## 2021-04-29 ENCOUNTER — Encounter: Payer: BC Managed Care – PPO | Admitting: Family Medicine

## 2021-05-07 ENCOUNTER — Ambulatory Visit: Payer: BC Managed Care – PPO | Admitting: Dermatology

## 2021-05-12 ENCOUNTER — Other Ambulatory Visit: Payer: Self-pay

## 2021-05-12 ENCOUNTER — Ambulatory Visit: Payer: BC Managed Care – PPO | Admitting: Dermatology

## 2021-05-12 DIAGNOSIS — L918 Other hypertrophic disorders of the skin: Secondary | ICD-10-CM

## 2021-05-12 DIAGNOSIS — L821 Other seborrheic keratosis: Secondary | ICD-10-CM

## 2021-05-12 DIAGNOSIS — L720 Epidermal cyst: Secondary | ICD-10-CM

## 2021-05-12 DIAGNOSIS — D229 Melanocytic nevi, unspecified: Secondary | ICD-10-CM

## 2021-05-12 DIAGNOSIS — D239 Other benign neoplasm of skin, unspecified: Secondary | ICD-10-CM

## 2021-05-12 DIAGNOSIS — Z1283 Encounter for screening for malignant neoplasm of skin: Secondary | ICD-10-CM

## 2021-05-12 DIAGNOSIS — L578 Other skin changes due to chronic exposure to nonionizing radiation: Secondary | ICD-10-CM

## 2021-05-12 DIAGNOSIS — L814 Other melanin hyperpigmentation: Secondary | ICD-10-CM

## 2021-05-12 DIAGNOSIS — D485 Neoplasm of uncertain behavior of skin: Secondary | ICD-10-CM

## 2021-05-12 DIAGNOSIS — D1801 Hemangioma of skin and subcutaneous tissue: Secondary | ICD-10-CM

## 2021-05-12 DIAGNOSIS — D2371 Other benign neoplasm of skin of right lower limb, including hip: Secondary | ICD-10-CM

## 2021-05-12 DIAGNOSIS — D225 Melanocytic nevi of trunk: Secondary | ICD-10-CM

## 2021-05-12 DIAGNOSIS — L905 Scar conditions and fibrosis of skin: Secondary | ICD-10-CM | POA: Diagnosis not present

## 2021-05-12 NOTE — Progress Notes (Signed)
New Patient Visit  Subjective  Tim Hawkins is a 50 y.o. male who presents for the following: Other (Mole all over - would like a TBSE today).  The patient has several areas to be evaluated today. The patient presents for Total-Body Skin Exam (TBSE) for skin cancer screening and mole check.  The following portions of the chart were reviewed this encounter and updated as appropriate:   Tobacco  Allergies  Meds  Problems  Med Hx  Surg Hx  Fam Hx     Review of Systems:  No other skin or systemic complaints except as noted in HPI or Assessment and Plan.  Objective  Well appearing patient in no apparent distress; mood and affect are within normal limits.  A full examination was performed including scalp, head, eyes, ears, nose, lips, neck, chest, axillae, abdomen, back, buttocks, bilateral upper extremities, bilateral lower extremities, hands, feet, fingers, toes, fingernails, and toenails. All findings within normal limits unless otherwise noted below.  Left medial inf scapula Irregular brown macule 1.1 cm       Right post waistline Irregular brown macule 1.5 cm       Left lat inf scapula Irregular brown macule 0.8 cm       Right lat neck 0.5 cm Subcutaneous nodule.   Left Flank, Right Flank Right Flank 1.1 cm Slightly irregular brown macule Left Flank  0.8 cm Slightly irregular brown macule            Assessment & Plan   Acrochordons (Skin Tags) - Fleshy, skin-colored pedunculated papules - Benign appearing.  - Observe. - If desired, they can be removed with an in office procedure that is not covered by insurance. - Please call the clinic if you notice any new or changing lesions.  Lentigines - Scattered tan macules - Due to sun exposure - Benign-appearing, observe - Recommend daily broad spectrum sunscreen SPF 30+ to sun-exposed areas, reapply every 2 hours as needed. - Call for any changes  Seborrheic Keratoses - Stuck-on, waxy,  tan-brown papules and/or plaques  - Benign-appearing - Discussed benign etiology and prognosis. - Observe - Call for any changes  Melanocytic Nevi - Tan-brown and/or pink-flesh-colored symmetric macules and papules - Benign appearing on exam today - Observation - Call clinic for new or changing moles - Recommend daily use of broad spectrum spf 30+ sunscreen to sun-exposed areas.   Hemangiomas - Red papules - Discussed benign nature - Observe - Call for any changes  Actinic Damage - Chronic condition, secondary to cumulative UV/sun exposure - diffuse scaly erythematous macules with underlying dyspigmentation - Recommend daily broad spectrum sunscreen SPF 30+ to sun-exposed areas, reapply every 2 hours as needed.  - Staying in the shade or wearing long sleeves, sun glasses (UVA+UVB protection) and wide brim hats (4-inch brim around the entire circumference of the hat) are also recommended for sun protection.  - Call for new or changing lesions.  Skin cancer screening performed today.  Neoplasm of uncertain behavior of skin (3) Left medial inf scapula Epidermal / dermal shaving Lesion diameter (cm):  1.1 Informed consent: discussed and consent obtained   Timeout: patient name, date of birth, surgical site, and procedure verified   Procedure prep:  Patient was prepped and draped in usual sterile fashion Prep type:  Isopropyl alcohol Anesthesia: the lesion was anesthetized in a standard fashion   Anesthetic:  1% lidocaine w/ epinephrine 1-100,000 buffered w/ 8.4% NaHCO3 Instrument used: flexible razor blade   Hemostasis achieved with: pressure, aluminum  chloride and electrodesiccation   Outcome: patient tolerated procedure well   Post-procedure details: sterile dressing applied and wound care instructions given   Dressing type: bandage and petrolatum    Specimen 1 - Surgical pathology Differential Diagnosis: Nevus vs dysplastic nevus Check Margins: No  Right post  waistline Epidermal / dermal shaving Lesion diameter (cm):  1.5 Informed consent: discussed and consent obtained   Timeout: patient name, date of birth, surgical site, and procedure verified   Procedure prep:  Patient was prepped and draped in usual sterile fashion Prep type:  Isopropyl alcohol Anesthesia: the lesion was anesthetized in a standard fashion   Anesthetic:  1% lidocaine w/ epinephrine 1-100,000 buffered w/ 8.4% NaHCO3 Instrument used: flexible razor blade   Hemostasis achieved with: pressure, aluminum chloride and electrodesiccation   Outcome: patient tolerated procedure well   Post-procedure details: sterile dressing applied and wound care instructions given   Dressing type: bandage and petrolatum    Specimen 2 - Surgical pathology Differential Diagnosis: Nevus vs dysplastic nevus Check Margins: No  Left lat inf scapula Epidermal / dermal shaving Lesion diameter (cm):  0.8 Informed consent: discussed and consent obtained   Timeout: patient name, date of birth, surgical site, and procedure verified   Procedure prep:  Patient was prepped and draped in usual sterile fashion Prep type:  Isopropyl alcohol Anesthesia: the lesion was anesthetized in a standard fashion   Anesthetic:  1% lidocaine w/ epinephrine 1-100,000 buffered w/ 8.4% NaHCO3 Instrument used: flexible razor blade   Hemostasis achieved with: pressure, aluminum chloride and electrodesiccation   Outcome: patient tolerated procedure well   Post-procedure details: sterile dressing applied and wound care instructions given   Dressing type: bandage and petrolatum    Specimen 3 - Surgical pathology Differential Diagnosis: Nevus vs dysplastic nevus Check Margins: No  Epidermal inclusion cyst Right lat neck Benign-appearing. Exam most consistent with an epidermal inclusion cyst. Discussed that a cyst is a benign growth that can grow over time and sometimes get irritated or inflamed. Recommend observation if it  is not bothersome. Discussed option of surgical excision to remove it if it is growing, symptomatic, or other changes noted. Please call for new or changing lesions so they can be evaluated.  Nevus (2) Left Flank; Right Flank May plan shave removal pending results from today.  Dermatofibroma Right Lower Leg - Anterior Benign-appearing.  Observation.  Call clinic for new or changing lesions.  Recommend daily use of broad spectrum spf 30+ sunscreen to sun-exposed areas.   Skin cancer screening  Return for 4-6 months.  I, Ashok Cordia, CMA, am acting as scribe for Sarina Ser, MD . Documentation: I have reviewed the above documentation for accuracy and completeness, and I agree with the above.  Sarina Ser, MD

## 2021-05-12 NOTE — Patient Instructions (Signed)
Wound Care Instructions  Cleanse wound gently with soap and water once a day then pat dry with clean gauze. Apply a thing coat of Petrolatum (petroleum jelly, "Vaseline") over the wound (unless you have an allergy to this). We recommend that you use a new, sterile tube of Vaseline. Do not pick or remove scabs. Do not remove the yellow or white "healing tissue" from the base of the wound.  Cover the wound with fresh, clean, nonstick gauze and secure with paper tape. You may use Band-Aids in place of gauze and tape if the would is small enough, but would recommend trimming much of the tape off as there is often too much. Sometimes Band-Aids can irritate the skin.  You should call the office for your biopsy report after 1 week if you have not already been contacted.  If you experience any problems, such as abnormal amounts of bleeding, swelling, significant bruising, significant pain, or evidence of infection, please call the office immediately.  FOR ADULT SURGERY PATIENTS: If you need something for pain relief you may take 1 extra strength Tylenol (acetaminophen) AND 2 Ibuprofen (200mg each) together every 4 hours as needed for pain. (do not take these if you are allergic to them or if you have a reason you should not take them.) Typically, you may only need pain medication for 1 to 3 days.   If you have any questions or concerns for your doctor, please call our main line at [phone] and press option 4 to reach your doctor's medical assistant. If no one answers, please leave a voicemail as directed and we will return your call as soon as possible. Messages left after 4 pm will be answered the following business day.   You may also send us a message via MyChart. We typically respond to MyChart messages within 1-2 business days.  For prescription refills, please ask your pharmacy to contact our office. Our fax number is [phone].  If you have an urgent issue when the clinic is closed that  cannot wait until the next business day, you can page your doctor at the number below.    Please note that while we do our best to be available for urgent issues outside of office hours, we are not available 24/7.   If you have an urgent issue and are unable to reach us, you may choose to seek medical care at your doctor's office, retail clinic, urgent care center, or emergency room.  If you have a medical emergency, please immediately call 911 or go to the emergency department.  Pager Numbers  - Dr. Kowalski: [phone]  - Dr. Moye: [phone]  - Dr. Stewart: [phone]  In the event of inclement weather, please call our main line at [phone] for an update on the status of any delays or closures.  Dermatology Medication Tips: Please keep the boxes that topical medications come in in order to help keep track of the instructions about where and how to use these. Pharmacies typically print the medication instructions only on the boxes and not directly on the medication tubes.   If your medication is too expensive, please contact our office at [phone] option 4 or send us a message through MyChart.   We are unable to tell what your co-pay for medications will be in advance as this is different depending on your insurance coverage. However, we may be able to find a substitute medication at lower cost or fill out paperwork to get insurance to cover a needed   medication.   If a prior authorization is required to get your medication covered by your insurance company, please allow us 1-2 business days to complete this process.  Drug prices often vary depending on where the prescription is filled and some pharmacies may offer cheaper prices.  The website www.goodrx.com contains coupons for medications through different pharmacies. The prices here do not account for what the cost may be with help from insurance (it may be cheaper with your insurance), but the website can give you the  price if you did not use any insurance.  - You can print the associated coupon and take it with your prescription to the pharmacy.  - You may also stop by our office during regular business hours and pick up a GoodRx coupon card.  - If you need your prescription sent electronically to a different pharmacy, notify our office through [facility] MyChart or by phone at [phone] option 4.   

## 2021-05-18 ENCOUNTER — Encounter: Payer: Self-pay | Admitting: Dermatology

## 2021-05-18 DIAGNOSIS — D239 Other benign neoplasm of skin, unspecified: Secondary | ICD-10-CM

## 2021-05-18 HISTORY — DX: Other benign neoplasm of skin, unspecified: D23.9

## 2021-05-21 ENCOUNTER — Telehealth: Payer: Self-pay

## 2021-05-21 NOTE — Telephone Encounter (Signed)
Left message for patient to call office for results/hd 

## 2021-05-21 NOTE — Telephone Encounter (Signed)
Patient advised of BX results and scheduled for surgery. aw 

## 2021-05-21 NOTE — Telephone Encounter (Signed)
-----   Message from Ralene Bathe, MD sent at 05/14/2021  6:06 PM EST ----- Diagnosis 1. Skin , left medial inf scapula DYSPLASTIC NEVUS WITH MODERATE TO SEVERE ATYPIA, DEEP MARGIN INVOLVED, SEE DESCRIPTION 2. Skin , right post waistline DYSPLASTIC COMPOUND NEVUS WITH SEVERE ATYPIA, DEEP MARGIN INVOLVED, SEE DESCRIPTION 3. Skin , left lat inf scapula DYSPLASTIC NEVUS WITH MODERATE TO SEVERE ATYPIA, WITH SCAR, PERIPHERAL AND DEEP MARGINS INVOLVED, SEE DESCRIPTION  1&3 - Moderate to severe dysplastic nevus Will need additional procedure Will discuss and examine at next appt  2- Severe dysplastic Schedule surgery

## 2021-07-15 ENCOUNTER — Ambulatory Visit: Payer: BC Managed Care – PPO | Admitting: Dermatology

## 2021-07-15 ENCOUNTER — Other Ambulatory Visit: Payer: Self-pay

## 2021-07-15 ENCOUNTER — Encounter: Payer: Self-pay | Admitting: Dermatology

## 2021-07-15 ENCOUNTER — Telehealth: Payer: Self-pay

## 2021-07-15 DIAGNOSIS — D485 Neoplasm of uncertain behavior of skin: Secondary | ICD-10-CM

## 2021-07-15 DIAGNOSIS — D235 Other benign neoplasm of skin of trunk: Secondary | ICD-10-CM

## 2021-07-15 DIAGNOSIS — D239 Other benign neoplasm of skin, unspecified: Secondary | ICD-10-CM

## 2021-07-15 DIAGNOSIS — D225 Melanocytic nevi of trunk: Secondary | ICD-10-CM | POA: Diagnosis not present

## 2021-07-15 MED ORDER — MUPIROCIN 2 % EX OINT: 1.0000 "application " | TOPICAL_OINTMENT | Freq: Every day | CUTANEOUS | 0 refills | Status: DC

## 2021-07-15 NOTE — Telephone Encounter (Signed)
Spoke with patient regarding surgery. He is doing fine/hd  

## 2021-07-15 NOTE — Progress Notes (Signed)
° °  Follow-Up Visit   Subjective  Tim Hawkins is a 51 y.o. male who presents for the following: Procedure (Biopsy proven severe dysplastic nevus of right post waistline - Excise today) and Other (Biopsy proven moderate-severe dysplastic nevi of left medial inf scapula, left lat inf scapula).  Accompanied by wife  The following portions of the chart were reviewed this encounter and updated as appropriate:   Tobacco   Allergies   Meds   Problems   Med Hx   Surg Hx   Fam Hx      Review of Systems:  No other skin or systemic complaints except as noted in HPI or Assessment and Plan.  Objective  Well appearing patient in no apparent distress; mood and affect are within normal limits.  A focused examination was performed including back. Relevant physical exam findings are noted in the Assessment and Plan.  Right post waistline Healing biopsy site 1.9 x 0.8 cm  Left lat inf scapula, Left medial inf scapula Healing biopsy sites   Assessment & Plan  Neoplasm of uncertain behavior of skin Right post waistline  Skin excision  Lesion length (cm):  1.9 Lesion width (cm):  0.8 Margin per side (cm):  0.2 Total excision diameter (cm):  2.3 Informed consent: discussed and consent obtained   Timeout: patient name, date of birth, surgical site, and procedure verified   Procedure prep:  Patient was prepped and draped in usual sterile fashion Prep type:  Isopropyl alcohol and povidone-iodine Anesthesia: the lesion was anesthetized in a standard fashion   Anesthetic:  1% lidocaine w/ epinephrine 1-100,000 buffered w/ 8.4% NaHCO3 Instrument used: #15 blade   Hemostasis achieved with: pressure   Hemostasis achieved with comment:  Electrocautery Outcome: patient tolerated procedure well with no complications   Post-procedure details: sterile dressing applied and wound care instructions given   Dressing type: bandage and pressure dressing (mupirocin)    Skin repair Complexity:   Complex Final length (cm):  3.5 Reason for type of repair: reduce tension to allow closure, reduce the risk of dehiscence, infection, and necrosis, reduce subcutaneous dead space and avoid a hematoma, allow closure of the large defect, preserve normal anatomy, preserve normal anatomical and functional relationships and enhance both functionality and cosmetic results   Undermining: area extensively undermined   Undermining comment:  Undermining defect 1.2 cm Subcutaneous layers (deep stitches):  Suture size:  2-0 Suture type: Vicryl (polyglactin 910)   Subcutaneous suture technique: inverted dermal. Fine/surface layer approximation (top stitches):  Suture size:  3-0 Suture type: nylon   Stitches: simple running   Suture removal (days):  7 Hemostasis achieved with: suture and pressure Outcome: patient tolerated procedure well with no complications   Post-procedure details: sterile dressing applied and wound care instructions given   Dressing type: bandage and pressure dressing (mupirocin)    mupirocin ointment (BACTROBAN) 2 % Apply 1 application topically daily. With dressing changes  Specimen 1 - Surgical pathology Differential Diagnosis: Biopsy proven severe dysplastic nevus Check Margins: No [id number]  Dysplastic nevus (2) Left lat inf scapula; Left medial inf scapula  Will plan shave removal on follow up.   Return in about 1 week (around 07/22/2021) for suture removal.  I, Ashok Cordia, CMA, am acting as scribe for Sarina Ser, MD . ThereDocumentation: I have reviewed the above documentation for accuracy and completeness, and I agree with the above.  Sarina Ser, MD

## 2021-07-15 NOTE — Patient Instructions (Signed)

## 2021-07-21 ENCOUNTER — Encounter: Payer: Self-pay | Admitting: Dermatology

## 2021-07-21 ENCOUNTER — Other Ambulatory Visit: Payer: Self-pay

## 2021-07-21 ENCOUNTER — Ambulatory Visit: Payer: BC Managed Care – PPO | Admitting: Dermatology

## 2021-07-21 DIAGNOSIS — L905 Scar conditions and fibrosis of skin: Secondary | ICD-10-CM | POA: Diagnosis not present

## 2021-07-21 DIAGNOSIS — D485 Neoplasm of uncertain behavior of skin: Secondary | ICD-10-CM | POA: Diagnosis not present

## 2021-07-21 DIAGNOSIS — D492 Neoplasm of unspecified behavior of bone, soft tissue, and skin: Secondary | ICD-10-CM

## 2021-07-21 DIAGNOSIS — Z4802 Encounter for removal of sutures: Secondary | ICD-10-CM

## 2021-07-21 NOTE — Progress Notes (Signed)
° °  Follow-Up Visit   Subjective  Tim Hawkins is a 51 y.o. male who presents for the following: Suture / Staple Removal (Right posterior waistline. Hx of DN. ) and Dysplastic nevi (Here for shave removal of Bx proven moderate to severe dysplastic nevi at left lateral inferior scapula and left medial inferior scapula. ).  The following portions of the chart were reviewed this encounter and updated as appropriate:  Tobacco   Allergies   Meds   Problems   Med Hx   Surg Hx   Fam Hx      Review of Systems: No other skin or systemic complaints except as noted in HPI or Assessment and Plan.  Objective  Well appearing patient in no apparent distress; mood and affect are within normal limits.  A focused examination was performed including right abdomen/waist. Relevant physical exam findings are noted in the Assessment and Plan.  left medial inferior scapula Pink healing wound  left lateral inferior scapula Pink healing wound   Assessment & Plan  Neoplasm of skin (2) left medial inferior scapula  Epidermal / dermal shaving  Lesion diameter (cm):  1.7 Informed consent: discussed and consent obtained   Timeout: patient name, date of birth, surgical site, and procedure verified   Procedure prep:  Patient was prepped and draped in usual sterile fashion Prep type:  Isopropyl alcohol Anesthesia: the lesion was anesthetized in a standard fashion   Anesthetic:  1% lidocaine w/ epinephrine 1-100,000 buffered w/ 8.4% NaHCO3 Instrument used: flexible razor blade   Hemostasis achieved with: pressure, aluminum chloride and electrodesiccation   Outcome: patient tolerated procedure well   Post-procedure details: sterile dressing applied and wound care instructions given   Dressing type: bandage and petrolatum    Specimen 1 - Surgical pathology Differential Diagnosis: R/O residual dysplastic nevus  Check Margins: No  Previous Bx: [id number]  left lateral inferior scapula  Epidermal /  dermal shaving  Lesion diameter (cm):  1.7 Informed consent: discussed and consent obtained   Timeout: patient name, date of birth, surgical site, and procedure verified   Procedure prep:  Patient was prepped and draped in usual sterile fashion Prep type:  Isopropyl alcohol Anesthesia: the lesion was anesthetized in a standard fashion   Anesthetic:  1% lidocaine w/ epinephrine 1-100,000 buffered w/ 8.4% NaHCO3 Instrument used: flexible razor blade   Hemostasis achieved with: pressure, aluminum chloride and electrodesiccation   Outcome: patient tolerated procedure well   Post-procedure details: sterile dressing applied and wound care instructions given   Dressing type: bandage and petrolatum    Specimen 2 - Surgical pathology Differential Diagnosis: R/O residual dysplastic nevus   Check Margins: No  Previous Bx: [id number]   Encounter for Removal of Sutures - Incision site at the right posterior waist is clean, dry and intact - Wound cleansed, sutures removed, wound cleansed and steri strips applied.  - Discussed pathology results showing residual DN, margins free  - Patient advised to keep steri-strips dry until they fall off. - Scars remodel for a full year. - Once steri-strips fall off, patient can apply over-the-counter silicone scar cream each night to help with scar remodeling if desired. - Patient advised to call with any concerns or if they notice any new or changing lesions.  Return for TBSE As Scheduled.  I, Emelia Salisbury, CMA, am acting as scribe for Sarina Ser, MD. Documentation: I have reviewed the above documentation for accuracy and completeness, and I agree with the above.  Sarina Ser, MD

## 2021-07-21 NOTE — Patient Instructions (Addendum)

## 2021-07-22 ENCOUNTER — Encounter: Payer: Self-pay | Admitting: Dermatology

## 2021-07-25 ENCOUNTER — Telehealth: Payer: Self-pay

## 2021-07-25 NOTE — Telephone Encounter (Signed)
Left message on voicemail to return my call.  

## 2021-07-25 NOTE — Telephone Encounter (Signed)
-----   Message from Ralene Bathe, MD sent at 07/23/2021  6:06 PM EST ----- Diagnosis 1. Skin , left medial inferior scapula RESHAVE, SCAR, NO RESIDUAL DYSPLASTIC NEVUS 2. Skin , left lateral inferior scapula RESHAVE, SCAR, NO RESIDUAL DYSPLASTIC NEVUS  1&2 - both sites of Moderate to Severe dysplastic nevi Margins clear - no residual Recheck next visit

## 2021-07-28 ENCOUNTER — Telehealth: Payer: Self-pay

## 2021-07-28 NOTE — Telephone Encounter (Signed)
-----   Message from Ralene Bathe, MD sent at 07/23/2021  6:06 PM EST ----- Diagnosis 1. Skin , left medial inferior scapula RESHAVE, SCAR, NO RESIDUAL DYSPLASTIC NEVUS 2. Skin , left lateral inferior scapula RESHAVE, SCAR, NO RESIDUAL DYSPLASTIC NEVUS  1&2 - both sites of Moderate to Severe dysplastic nevi Margins clear - no residual Recheck next visit

## 2021-07-28 NOTE — Telephone Encounter (Signed)
Patient advised of BX results .aw 

## 2021-08-27 ENCOUNTER — Other Ambulatory Visit: Payer: Self-pay

## 2021-08-27 MED ORDER — METFORMIN HCL 500 MG PO TABS: 500.0000 mg | ORAL_TABLET | Freq: Every day | ORAL | 0 refills | Status: DC

## 2021-08-27 NOTE — Progress Notes (Signed)
ER sent.  ?

## 2021-09-18 ENCOUNTER — Encounter: Payer: Self-pay | Admitting: Dermatology

## 2021-10-20 ENCOUNTER — Ambulatory Visit: Payer: BC Managed Care – PPO | Admitting: Dermatology

## 2021-11-03 ENCOUNTER — Ambulatory Visit: Payer: BC Managed Care – PPO | Admitting: Dermatology

## 2021-11-09 ENCOUNTER — Other Ambulatory Visit: Payer: Self-pay | Admitting: Family Medicine

## 2021-11-11 NOTE — Telephone Encounter (Signed)
Pt has canceled several appts and is past due for cpe/labs.  ?

## 2021-11-24 ENCOUNTER — Other Ambulatory Visit: Payer: Self-pay | Admitting: Family Medicine

## 2021-12-22 DIAGNOSIS — E119 Type 2 diabetes mellitus without complications: Secondary | ICD-10-CM | POA: Diagnosis not present

## 2021-12-22 DIAGNOSIS — Z125 Encounter for screening for malignant neoplasm of prostate: Secondary | ICD-10-CM | POA: Diagnosis not present

## 2021-12-22 DIAGNOSIS — Z13220 Encounter for screening for lipoid disorders: Secondary | ICD-10-CM | POA: Diagnosis not present

## 2021-12-29 DIAGNOSIS — E119 Type 2 diabetes mellitus without complications: Secondary | ICD-10-CM | POA: Diagnosis not present

## 2021-12-29 DIAGNOSIS — R972 Elevated prostate specific antigen [PSA]: Secondary | ICD-10-CM | POA: Diagnosis not present

## 2021-12-29 DIAGNOSIS — Z13220 Encounter for screening for lipoid disorders: Secondary | ICD-10-CM | POA: Diagnosis not present

## 2022-01-05 DIAGNOSIS — E113393 Type 2 diabetes mellitus with moderate nonproliferative diabetic retinopathy without macular edema, bilateral: Secondary | ICD-10-CM | POA: Diagnosis not present

## 2022-01-15 ENCOUNTER — Other Ambulatory Visit: Payer: Self-pay | Admitting: Family Medicine

## 2022-01-15 NOTE — Progress Notes (Signed)
Looks like pt has established with [facility].

## 2022-01-19 ENCOUNTER — Other Ambulatory Visit (INDEPENDENT_AMBULATORY_CARE_PROVIDER_SITE_OTHER): Payer: BC Managed Care – PPO

## 2022-01-19 ENCOUNTER — Other Ambulatory Visit: Payer: Self-pay | Admitting: Family Medicine

## 2022-01-19 DIAGNOSIS — E118 Type 2 diabetes mellitus with unspecified complications: Secondary | ICD-10-CM | POA: Diagnosis not present

## 2022-01-19 LAB — POCT GLYCOSYLATED HEMOGLOBIN (HGB A1C): Hemoglobin A1C: 8 % — AB (ref 4.0–5.6)

## 2023-05-05 ENCOUNTER — Other Ambulatory Visit: Payer: Self-pay | Admitting: Infectious Diseases

## 2023-05-05 DIAGNOSIS — I10 Essential (primary) hypertension: Secondary | ICD-10-CM

## 2023-05-05 DIAGNOSIS — E119 Type 2 diabetes mellitus without complications: Secondary | ICD-10-CM

## 2023-05-17 ENCOUNTER — Ambulatory Visit
Admission: RE | Admit: 2023-05-17 | Discharge: 2023-05-17 | Disposition: A | Discharge status: Home / Self Care | Payer: Self-pay | Source: Ambulatory Visit | Attending: Infectious Diseases | Admitting: Infectious Diseases

## 2023-05-17 DIAGNOSIS — I10 Essential (primary) hypertension: Secondary | ICD-10-CM | POA: Insufficient documentation

## 2023-05-17 DIAGNOSIS — E119 Type 2 diabetes mellitus without complications: Secondary | ICD-10-CM | POA: Insufficient documentation

## 2023-06-28 ENCOUNTER — Ambulatory Visit
Admission: RE | Admit: 2023-06-28 | Discharge: 2023-06-28 | Disposition: A | Discharge status: Home / Self Care | Payer: BC Managed Care – PPO | Source: Ambulatory Visit | Attending: Internal Medicine | Admitting: Internal Medicine

## 2023-06-28 ENCOUNTER — Encounter
Admission: RE | Disposition: A | Discharge status: Home / Self Care | Payer: Self-pay | Source: Ambulatory Visit | Attending: Internal Medicine

## 2023-06-28 ENCOUNTER — Encounter: Payer: Self-pay | Admitting: Internal Medicine

## 2023-06-28 ENCOUNTER — Other Ambulatory Visit: Payer: Self-pay

## 2023-06-28 DIAGNOSIS — I2584 Coronary atherosclerosis due to calcified coronary lesion: Secondary | ICD-10-CM | POA: Insufficient documentation

## 2023-06-28 DIAGNOSIS — R9439 Abnormal result of other cardiovascular function study: Secondary | ICD-10-CM

## 2023-06-28 DIAGNOSIS — I2510 Atherosclerotic heart disease of native coronary artery without angina pectoris: Secondary | ICD-10-CM | POA: Insufficient documentation

## 2023-06-28 HISTORY — PX: LEFT HEART CATH AND CORONARY ANGIOGRAPHY: CATH118249

## 2023-06-28 LAB — CBC WITH DIFFERENTIAL/PLATELET
Abs Immature Granulocytes: 0.01 10*3/uL (ref 0.00–0.07)
Basophils Absolute: 0 10*3/uL (ref 0.0–0.1)
Basophils Relative: 0 %
Eosinophils Absolute: 0.2 10*3/uL (ref 0.0–0.5)
Eosinophils Relative: 2 %
HCT: 38.5 % — ABNORMAL LOW (ref 39.0–52.0)
Hemoglobin: 12.7 g/dL — ABNORMAL LOW (ref 13.0–17.0)
Immature Granulocytes: 0 %
Lymphocytes Relative: 25 %
Lymphs Abs: 2.3 10*3/uL (ref 0.7–4.0)
MCH: 27.5 pg (ref 26.0–34.0)
MCHC: 33 g/dL (ref 30.0–36.0)
MCV: 83.5 fL (ref 80.0–100.0)
Monocytes Absolute: 0.6 10*3/uL (ref 0.1–1.0)
Monocytes Relative: 6 %
Neutro Abs: 6 10*3/uL (ref 1.7–7.7)
Neutrophils Relative %: 67 %
Platelets: 244 10*3/uL (ref 150–400)
RBC: 4.61 MIL/uL (ref 4.22–5.81)
RDW: 14 % (ref 11.5–15.5)
WBC: 9.1 10*3/uL (ref 4.0–10.5)
nRBC: 0 % (ref 0.0–0.2)

## 2023-06-28 LAB — BASIC METABOLIC PANEL
Anion gap: 12 (ref 5–15)
BUN: 13 mg/dL (ref 6–20)
CO2: 22 mmol/L (ref 22–32)
Calcium: 9.2 mg/dL (ref 8.9–10.3)
Chloride: 101 mmol/L (ref 98–111)
Creatinine, Ser: 0.57 mg/dL — ABNORMAL LOW (ref 0.61–1.24)
GFR, Estimated: >60 mL/min (ref >60)
Glucose, Bld: 204 mg/dL — ABNORMAL HIGH (ref 70–99)
Potassium: 4.9 mmol/L (ref 3.5–5.1)
Sodium: 135 mmol/L (ref 135–145)

## 2023-06-28 SURGERY — LEFT HEART CATH AND CORONARY ANGIOGRAPHY
Anesthesia: Moderate Sedation

## 2023-06-28 MED ORDER — HEPARIN (PORCINE) IN NACL 1000-0.9 UT/500ML-% IV SOLN
INTRAVENOUS | Status: AC
  Filled 2023-06-28: qty 1000

## 2023-06-28 MED ORDER — LIDOCAINE HCL 1 % IJ SOLN
INTRAMUSCULAR | Status: AC
  Filled 2023-06-28: qty 20

## 2023-06-28 MED ORDER — SODIUM CHLORIDE 0.9 % IV SOLN: 250.0000 mL | INTRAVENOUS | Status: DC | PRN

## 2023-06-28 MED ORDER — SODIUM CHLORIDE 0.9 % WEIGHT BASED INFUSION: 1.0000 mL/kg/h | INTRAVENOUS | Status: DC

## 2023-06-28 MED ORDER — ASPIRIN 81 MG PO CHEW: 81.0000 mg | CHEWABLE_TABLET | ORAL | Status: DC

## 2023-06-28 MED ORDER — HEPARIN SODIUM (PORCINE) 1000 UNIT/ML IJ SOLN
INTRAMUSCULAR | Status: DC | PRN
  Administered 2023-06-28: 4000 [IU] via INTRAVENOUS

## 2023-06-28 MED ORDER — FENTANYL CITRATE (PF) 100 MCG/2ML IJ SOLN
INTRAMUSCULAR | Status: DC | PRN
  Administered 2023-06-28: 25 ug via INTRAVENOUS

## 2023-06-28 MED ORDER — FENTANYL CITRATE (PF) 100 MCG/2ML IJ SOLN
INTRAMUSCULAR | Status: AC
  Filled 2023-06-28: qty 2

## 2023-06-28 MED ORDER — SODIUM CHLORIDE 0.9% FLUSH: 3.0000 mL | INTRAVENOUS | Status: DC | PRN

## 2023-06-28 MED ORDER — VERAPAMIL HCL 2.5 MG/ML IV SOLN
INTRAVENOUS | Status: AC
Start: 2023-06-28 — End: ?
  Filled 2023-06-28: qty 2

## 2023-06-28 MED ORDER — VERAPAMIL HCL 2.5 MG/ML IV SOLN
INTRAVENOUS | Status: DC | PRN
  Administered 2023-06-28: 2.5 mg via INTRA_ARTERIAL

## 2023-06-28 MED ORDER — LIDOCAINE HCL (PF) 1 % IJ SOLN
INTRAMUSCULAR | Status: DC | PRN
  Administered 2023-06-28: 2 mL

## 2023-06-28 MED ORDER — MIDAZOLAM HCL 2 MG/2ML IJ SOLN
INTRAMUSCULAR | Status: DC | PRN
  Administered 2023-06-28: 1 mg via INTRAVENOUS

## 2023-06-28 MED ORDER — SODIUM CHLORIDE 0.9 % WEIGHT BASED INFUSION
3.0000 mL/kg/h | INTRAVENOUS | Status: DC
  Administered 2023-06-28: 3 mL/kg/h via INTRAVENOUS

## 2023-06-28 MED ORDER — MIDAZOLAM HCL 2 MG/2ML IJ SOLN
INTRAMUSCULAR | Status: AC
  Filled 2023-06-28: qty 2

## 2023-06-28 MED ORDER — SODIUM CHLORIDE 0.9 % WEIGHT BASED INFUSION: 3.0000 mL/kg/h | INTRAVENOUS | Status: AC

## 2023-06-28 MED ORDER — SODIUM CHLORIDE 0.9% FLUSH: 3.0000 mL | Freq: Two times a day (BID) | INTRAVENOUS | Status: DC

## 2023-06-28 MED ORDER — HEPARIN SODIUM (PORCINE) 1000 UNIT/ML IJ SOLN
INTRAMUSCULAR | Status: AC
  Filled 2023-06-28: qty 10

## 2023-06-28 MED ORDER — HEPARIN (PORCINE) IN NACL 1000-0.9 UT/500ML-% IV SOLN
INTRAVENOUS | Status: DC | PRN
  Administered 2023-06-28 (×2): 500 mL

## 2023-06-28 MED ORDER — IOHEXOL 300 MG/ML  SOLN
INTRAMUSCULAR | Status: DC | PRN
  Administered 2023-06-28: 78 mL

## 2023-06-28 SURGICAL SUPPLY — 10 items
CATH 5FR JL3.5 JR4 ANG PIG MP (CATHETERS) IMPLANT
DEVICE RAD TR BAND REGULAR (VASCULAR PRODUCTS) IMPLANT
DRAPE BRACHIAL (DRAPES) IMPLANT
GLIDESHEATH SLEND SS 6F .021 (SHEATH) IMPLANT
GUIDEWIRE INQWIRE 1.5J.035X260 (WIRE) IMPLANT
INQWIRE 1.5J .035X260CM (WIRE) ×1
PACK CARDIAC CATH (CUSTOM PROCEDURE TRAY) ×1 IMPLANT
PROTECTION STATION PRESSURIZED (MISCELLANEOUS) ×1
SET ATX-X65L (MISCELLANEOUS) IMPLANT
STATION PROTECTION PRESSURIZED (MISCELLANEOUS) IMPLANT

## 2023-06-29 ENCOUNTER — Encounter: Payer: Self-pay | Admitting: Internal Medicine

## 2023-06-29 LAB — CARDIAC CATHETERIZATION: Cath EF Quantitative: 60 %
